# Patient Record
Sex: Female | Born: 1995 | Race: White | Hispanic: No | Marital: Single | Smoking: Former smoker
Health system: Southern US, Community
[De-identification: ages and names within clinical notes are randomized; demographics above are authoritative.]

## PROBLEM LIST (undated history)

## (undated) DIAGNOSIS — E282 Polycystic ovarian syndrome: Secondary | ICD-10-CM

## (undated) DIAGNOSIS — E049 Nontoxic goiter, unspecified: Secondary | ICD-10-CM

## (undated) DIAGNOSIS — N915 Oligomenorrhea, unspecified: Secondary | ICD-10-CM

## (undated) DIAGNOSIS — E063 Autoimmune thyroiditis: Secondary | ICD-10-CM

## (undated) DIAGNOSIS — R109 Unspecified abdominal pain: Secondary | ICD-10-CM

## (undated) HISTORY — DX: Autoimmune thyroiditis: E06.3

## (undated) HISTORY — DX: Oligomenorrhea, unspecified: N91.5

## (undated) HISTORY — DX: Nontoxic goiter, unspecified: E04.9

## (undated) HISTORY — PX: NO PAST SURGERIES: SHX2092

## (undated) HISTORY — DX: Unspecified abdominal pain: R10.9

---
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
## 2015-01-02 ENCOUNTER — Telehealth: Payer: Self-pay | Admitting: Endocrinology

## 2015-01-02 NOTE — Telephone Encounter (Signed)
Please call pt back @ [phone] concerning side effects of medication she is taking

## 2015-01-02 NOTE — Telephone Encounter (Signed)
Requested a call back from the pt to discuss,

## 2015-01-03 NOTE — Telephone Encounter (Signed)
Requested a call back from the pt to discuss.  

## 2015-01-03 NOTE — Telephone Encounter (Signed)
I contacted the pt and advised of MD's note below.

## 2015-01-03 NOTE — Telephone Encounter (Signed)
Pt called stating back in April she missed her thyroid medication for about a month due to her being out of town. Pt stated since April she has been on her period. Pt wanted to know if missing her thyroid medication could have affected this. Pt stated she has checked with her gynecologist and was advised her birth control probably was not the cause of this.  Please advise, Thanks!

## 2015-01-03 NOTE — Telephone Encounter (Signed)
please call patient: You are right: both are possible

## 2015-02-22 ENCOUNTER — Other Ambulatory Visit: Payer: Self-pay | Admitting: Endocrinology

## 2015-02-28 ENCOUNTER — Telehealth: Payer: Self-pay | Admitting: Endocrinology

## 2015-02-28 MED ORDER — LEVOTHYROXINE SODIUM 100 MCG PO TABS: ORAL_TABLET | ORAL | Status: DC

## 2015-02-28 NOTE — Telephone Encounter (Signed)
Rx sent per pt's request.  

## 2015-02-28 NOTE — Telephone Encounter (Signed)
Pt needs refill on levothyroxine, took last pill today

## 2015-08-01 ENCOUNTER — Other Ambulatory Visit: Payer: Self-pay | Admitting: Endocrinology

## 2015-08-05 ENCOUNTER — Other Ambulatory Visit: Payer: Self-pay | Admitting: Endocrinology

## 2015-08-07 ENCOUNTER — Telehealth: Payer: Self-pay | Admitting: Endocrinology

## 2015-08-07 MED ORDER — LEVOTHYROXINE SODIUM 100 MCG PO TABS: ORAL_TABLET | ORAL | Status: DC

## 2015-08-07 NOTE — Telephone Encounter (Signed)
Rx sent, PA is not needed. Rx was refilled for 30 days plus 1 refill. Office visit is due before any more refills can be given.

## 2015-08-07 NOTE — Telephone Encounter (Signed)
Patient need a refill of levothyroxine, with extra refills, and also need a Prior Auth send to   [id number] [place],  - [address] [phone] (Phone) [phone] (Fax)

## 2015-09-03 ENCOUNTER — Other Ambulatory Visit: Payer: Self-pay | Admitting: Endocrinology

## 2015-09-04 ENCOUNTER — Ambulatory Visit: Payer: Medicaid Other | Admitting: Endocrinology

## 2015-09-30 ENCOUNTER — Other Ambulatory Visit: Payer: Self-pay | Admitting: Endocrinology

## 2016-07-16 ENCOUNTER — Encounter: Payer: Self-pay | Admitting: Endocrinology

## 2016-07-16 ENCOUNTER — Ambulatory Visit (INDEPENDENT_AMBULATORY_CARE_PROVIDER_SITE_OTHER): Payer: BLUE CROSS/BLUE SHIELD | Admitting: Endocrinology

## 2016-07-16 VITALS — BP 106/70 | HR 57 | Ht 69.5 in | Wt 183.0 lb

## 2016-07-16 DIAGNOSIS — E039 Hypothyroidism, unspecified: Secondary | ICD-10-CM

## 2016-07-16 MED ORDER — LEVOTHYROXINE SODIUM 100 MCG PO TABS: ORAL_TABLET | ORAL | 1 refills | Status: DC

## 2016-07-16 NOTE — Progress Notes (Signed)
   Subjective:    Patient ID: Vanessa Zavala, female    DOB: 1996-06-21, 21 y.o.   MRN: [id number]  HPI Pt returns for f/u of hypothyroidism (dx'ed in 2012; he has been on synthroid since then; US in 2016 showed heterogeneous tissue, but no nodule).  She has not recently taken synthroid.  She has fatigue and weight gain.   Past Medical History:  Diagnosis Date  . Abdominal pain   . Goiter   . Hypothyroidism, acquired, autoimmune   . Oligomenorrhea   . Thyroiditis, autoimmune     No past surgical history on file.  Social History   Social History  . Marital status: Single    Spouse name: N/A  . Number of children: N/A  . Years of education: N/A   Occupational History  . Not on file.   Social History Main Topics  . Smoking status: Current Every Day Smoker  . Smokeless tobacco: Never Used  . Alcohol use No  . Drug use: No  . Sexual activity: Not on file   Other Topics Concern  . Not on file   Social History Narrative  . No narrative on file    No current outpatient prescriptions on file prior to visit.   No current facility-administered medications on file prior to visit.     No Known Allergies  Family History  Problem Relation Age of Onset  . Thyroid disease Mother   . Diabetes Maternal Grandmother   . Cancer Maternal Grandmother   . Thyroid disease Maternal Grandmother     BP 106/70   Pulse (!) 57   Ht 5' 9.5" (1.765 m)   Wt 183 lb (83 kg)   SpO2 98%   BMI 26.64 kg/m    Review of Systems Denies edema    Objective:   Physical Exam VITAL SIGNS:  See vs page GENERAL: no distress NECK: There is no palpable thyroid enlargement.  No thyroid nodule is palpable.  No palpable lymphadenopathy at the anterior neck.       Assessment & Plan:  Hypothyroidism: therapy limited by medication noncompliance.   Patient is advised the following: Patient Instructions  I have sent a prescription to your pharmacy, to refill the thyroid pill.   In view of your  medical condition, you should avoid pregnancy until we have decided it is safe.   In 1 month, please come to the lab, to recheck the blood test.   Please come back for a follow-up appointment in 6 months.

## 2016-07-16 NOTE — Patient Instructions (Addendum)
I have sent a prescription to your pharmacy, to refill the thyroid pill.   In view of your medical condition, you should avoid pregnancy until we have decided it is safe.   In 1 month, please come to the lab, to recheck the blood test.   Please come back for a follow-up appointment in 6 months.

## 2016-07-30 DIAGNOSIS — E063 Autoimmune thyroiditis: Secondary | ICD-10-CM | POA: Insufficient documentation

## 2016-08-12 ENCOUNTER — Ambulatory Visit: Payer: BLUE CROSS/BLUE SHIELD | Admitting: Physician Assistant

## 2016-08-12 ENCOUNTER — Encounter: Payer: Self-pay | Admitting: Physician Assistant

## 2016-08-16 ENCOUNTER — Encounter: Payer: Self-pay | Admitting: Endocrinology

## 2016-08-16 ENCOUNTER — Other Ambulatory Visit: Payer: BLUE CROSS/BLUE SHIELD

## 2016-11-18 ENCOUNTER — Other Ambulatory Visit: Payer: Self-pay

## 2016-11-18 ENCOUNTER — Telehealth: Payer: Self-pay | Admitting: Endocrinology

## 2016-11-18 MED ORDER — LEVOTHYROXINE SODIUM 100 MCG PO TABS: ORAL_TABLET | ORAL | 1 refills | Status: DC

## 2016-11-18 NOTE — Telephone Encounter (Signed)
Ordered

## 2016-11-18 NOTE — Telephone Encounter (Signed)
Pt needs refill on levothyroxine 100 mg called into [facility] in [place]

## 2016-12-05 ENCOUNTER — Telehealth: Payer: Self-pay | Admitting: Endocrinology

## 2016-12-05 ENCOUNTER — Other Ambulatory Visit: Payer: Self-pay

## 2016-12-05 MED ORDER — LEVOTHYROXINE SODIUM 100 MCG PO TABS: ORAL_TABLET | ORAL | 1 refills | Status: DC

## 2016-12-05 NOTE — Telephone Encounter (Signed)
[place], asking for another prescription of: levothyroxine (SYNTHROID, LEVOTHROID) 100 MCG tablet [[id number]][[id number]]

## 2016-12-05 NOTE — Telephone Encounter (Signed)
Ordered

## 2016-12-05 NOTE — Telephone Encounter (Signed)
Ok to refill 

## 2017-01-13 ENCOUNTER — Ambulatory Visit: Payer: BLUE CROSS/BLUE SHIELD | Admitting: Endocrinology

## 2017-01-13 ENCOUNTER — Telehealth: Payer: Self-pay | Admitting: Endocrinology

## 2017-01-13 DIAGNOSIS — Z0289 Encounter for other administrative examinations: Secondary | ICD-10-CM

## 2017-01-13 NOTE — Telephone Encounter (Signed)
Please come back for a follow-up appointment in 3 months 

## 2017-01-13 NOTE — Telephone Encounter (Signed)
Patient no showed today's appt. Please advise on how to follow up. °A. No follow up necessary. °B. Follow up urgent. Contact patient immediately. °C. Follow up necessary. Contact patient and schedule visit in ___ days. °D. Follow up advised. Contact patient and schedule visit in ____weeks. ° °

## 2017-01-28 ENCOUNTER — Encounter: Payer: Self-pay | Admitting: Endocrinology

## 2017-01-28 NOTE — Telephone Encounter (Signed)
Attempted to contact the pt, no answer and the vm is not yet set up. Mailed letter

## 2018-02-18 ENCOUNTER — Other Ambulatory Visit: Payer: Self-pay

## 2018-02-18 ENCOUNTER — Ambulatory Visit (HOSPITAL_COMMUNITY)
Admission: EM | Admit: 2018-02-18 | Discharge: 2018-02-18 | Disposition: A | Discharge status: Home / Self Care | Payer: 59 | Attending: Family Medicine | Admitting: Family Medicine

## 2018-02-18 ENCOUNTER — Encounter (HOSPITAL_COMMUNITY): Payer: Self-pay | Admitting: Emergency Medicine

## 2018-02-18 DIAGNOSIS — N760 Acute vaginitis: Secondary | ICD-10-CM

## 2018-02-18 DIAGNOSIS — Z79890 Hormone replacement therapy (postmenopausal): Secondary | ICD-10-CM | POA: Insufficient documentation

## 2018-02-18 DIAGNOSIS — Z3202 Encounter for pregnancy test, result negative: Secondary | ICD-10-CM

## 2018-02-18 DIAGNOSIS — B9689 Other specified bacterial agents as the cause of diseases classified elsewhere: Secondary | ICD-10-CM | POA: Diagnosis not present

## 2018-02-18 DIAGNOSIS — E039 Hypothyroidism, unspecified: Secondary | ICD-10-CM | POA: Diagnosis not present

## 2018-02-18 DIAGNOSIS — F17200 Nicotine dependence, unspecified, uncomplicated: Secondary | ICD-10-CM | POA: Diagnosis not present

## 2018-02-18 DIAGNOSIS — N898 Other specified noninflammatory disorders of vagina: Secondary | ICD-10-CM | POA: Diagnosis present

## 2018-02-18 LAB — POCT URINALYSIS DIP (DEVICE)
Bilirubin Urine: NEGATIVE
Glucose, UA: NEGATIVE mg/dL
KETONES UR: NEGATIVE mg/dL
Leukocytes, UA: NEGATIVE
Nitrite: NEGATIVE
PH: 7 (ref 5.0–8.0)
PROTEIN: NEGATIVE mg/dL
SPECIFIC GRAVITY, URINE: 1.015 (ref 1.005–1.030)
Urobilinogen, UA: 0.2 mg/dL (ref 0.0–1.0)

## 2018-02-18 LAB — POCT PREGNANCY, URINE: Preg Test, Ur: NEGATIVE

## 2018-02-18 MED ORDER — METRONIDAZOLE 500 MG PO TABS: 500.0000 mg | ORAL_TABLET | Freq: Two times a day (BID) | ORAL | 0 refills | Status: DC

## 2018-02-18 NOTE — ED Provider Notes (Signed)
[facility]    CSN: [id number] Arrival date & time: 02/18/18  1136     History   Chief Complaint Chief Complaint  Patient presents with  . Vaginal Discharge    HPI Vanessa Zavala is a 22 y.o. female.   Patient has vaginal discharge associated with odor.  She denies itching.  Been with same sexual partner for over a year.  Denies pain.  HPI  Past Medical History:  Diagnosis Date  . Abdominal pain   . Goiter   . Hypothyroidism, acquired, autoimmune   . Oligomenorrhea   . Thyroiditis, autoimmune     Patient Active Problem List   Diagnosis Date Noted  . Abdominal pain   . Hypothyroidism, acquired, autoimmune   . Thyroiditis, autoimmune   . Goiter   . Oligomenorrhea   . Hypothyroid 11/29/2010    History reviewed. No pertinent surgical history.  OB History   None      Home Medications    Prior to Admission medications   Medication Sig Start Date End Date Taking? Authorizing Provider  levothyroxine (SYNTHROID, LEVOTHROID) 100 MCG tablet TAKE 1 TABLET (100 MCG TOTAL) BY MOUTH DAILY BEFORE BREAKFAST. 12/05/16   Romero BellingEllison, Sean, MD    Family History Family History  Problem Relation Age of Onset  . Thyroid disease Mother   . Diabetes Maternal Grandmother   . Cancer Maternal Grandmother   . Thyroid disease Maternal Grandmother     Social History Social History   Tobacco Use  . Smoking status: Current Every Day Smoker  . Smokeless tobacco: Never Used  Substance Use Topics  . Alcohol use: No  . Drug use: No     Allergies   Patient has no known allergies.   Review of Systems Review of Systems  Constitutional: Negative for chills and fever.  HENT: Negative for ear pain and sore throat.   Eyes: Negative for pain and visual disturbance.  Respiratory: Negative for cough and shortness of breath.   Cardiovascular: Negative for chest pain and palpitations.  Gastrointestinal: Negative for abdominal pain and vomiting.  Genitourinary: Positive  for vaginal discharge. Negative for dysuria and hematuria.  Musculoskeletal: Negative for arthralgias and back pain.  Skin: Negative for color change and rash.  Neurological: Negative for seizures and syncope.  All other systems reviewed and are negative.    Physical Exam Triage Vital Signs ED Triage Vitals  Enc Vitals Group     BP 02/18/18 1151 120/75     Pulse Rate 02/18/18 1151 85     Resp --      Temp 02/18/18 1151 98.4 F (36.9 C)     Temp Source 02/18/18 1151 Oral     SpO2 02/18/18 1151 100 %     Weight --      Height --      Head Circumference --      Peak Flow --      Pain Score 02/18/18 1149 0     Pain Loc --      Pain Edu? --      Excl. in GC? --    No data found.  Updated Vital Signs BP 120/75 (BP Location: Left Arm)   Pulse 85   Temp 98.4 F (36.9 C) (Oral)   LMP 02/16/2018 (Exact Date)   SpO2 100%   Visual Acuity Right Eye Distance:   Left Eye Distance:   Bilateral Distance:    Right Eye Near:   Left Eye Near:    Bilateral  Near:     Physical Exam  Constitutional: She appears well-developed and well-nourished.  Genitourinary: Vaginal discharge found.     UC Treatments / Results  Labs (all labs ordered are listed, but only abnormal results are displayed) Labs Reviewed - No data to display  EKG None  Radiology No results found.  Procedures Procedures (including critical care time)  Medications Ordered in UC Medications - No data to display  Initial Impression / Assessment and Plan / UC Course  I have reviewed the triage vital signs and the nursing notes.  Pertinent labs & imaging results that were available during my care of the patient were reviewed by me and considered in my medical decision making (see chart for details).     Vaginitis, probable bacterial vaginosis.  Wet prep taken. Final Clinical Impressions(s) / UC Diagnoses   Final diagnoses:  BV (bacterial vaginosis)   Discharge Instructions   None    ED  Prescriptions    None     Controlled Substance Prescriptions [place] Controlled Substance Registry consulted? No   Frederica KusterMiller, Stephen M, MD 02/18/18 [phone]

## 2018-02-18 NOTE — ED Triage Notes (Signed)
Pt reports vaginal discharge with a foul odor and some vaginal burning for the last month.

## 2018-02-19 LAB — CERVICOVAGINAL ANCILLARY ONLY
Chlamydia: NEGATIVE
Neisseria Gonorrhea: NEGATIVE
Trichomonas: NEGATIVE

## 2018-05-31 ENCOUNTER — Other Ambulatory Visit: Payer: Self-pay

## 2018-05-31 ENCOUNTER — Encounter (HOSPITAL_COMMUNITY): Payer: Self-pay

## 2018-05-31 ENCOUNTER — Ambulatory Visit (HOSPITAL_COMMUNITY)
Admission: EM | Admit: 2018-05-31 | Discharge: 2018-05-31 | Disposition: A | Discharge status: Home / Self Care | Payer: 59 | Attending: Family Medicine | Admitting: Family Medicine

## 2018-05-31 DIAGNOSIS — R300 Dysuria: Secondary | ICD-10-CM | POA: Diagnosis present

## 2018-05-31 DIAGNOSIS — N3001 Acute cystitis with hematuria: Secondary | ICD-10-CM | POA: Insufficient documentation

## 2018-05-31 DIAGNOSIS — F17200 Nicotine dependence, unspecified, uncomplicated: Secondary | ICD-10-CM | POA: Insufficient documentation

## 2018-05-31 DIAGNOSIS — R350 Frequency of micturition: Secondary | ICD-10-CM | POA: Diagnosis present

## 2018-05-31 LAB — POCT URINALYSIS DIP (DEVICE)
Glucose, UA: 250 mg/dL — AB
Hgb urine dipstick: NEGATIVE
Ketones, ur: 15 mg/dL — AB
Nitrite: POSITIVE — AB
Protein, ur: >=300 mg/dL — AB
Specific Gravity, Urine: 1.01 (ref 1.005–1.030)
pH: 5 (ref 5.0–8.0)

## 2018-05-31 LAB — POCT PREGNANCY, URINE: Preg Test, Ur: NEGATIVE

## 2018-05-31 MED ORDER — PHENAZOPYRIDINE HCL 200 MG PO TABS: 200.0000 mg | ORAL_TABLET | Freq: Three times a day (TID) | ORAL | 0 refills | Status: DC

## 2018-05-31 MED ORDER — CEPHALEXIN 500 MG PO CAPS: 500.0000 mg | ORAL_CAPSULE | Freq: Two times a day (BID) | ORAL | 0 refills | Status: AC

## 2018-05-31 NOTE — ED Provider Notes (Signed)
[facility]   CC: UTI symptoms  SUBJECTIVE:  Vanessa Zavala is a 22 y.o. female who complains of urinary frequency, urgency and dysuria for the past 2 days with worsening symptoms this morning.  Admits to excessive caffeine intake 2 days ago, and sexual intercourse yesterday.  Admits to constant pressure localized to lower abdominal.  Has tried OTC AZO without relief.  Symptoms are made worse with urination.  Admits to similar symptoms in the past and diagnosed with UTI.  Admits to 2 episodes of vomiting yesterday, but now resovled.  Denies fever, chills, nausea, flank pain, abnormal vaginal discharge or bleeding, hematuria.    LMP: No LMP recorded. (Menstrual status: Irregular Periods).  ROS: As in HPI.  Past Medical History:  Diagnosis Date  . Abdominal pain   . Goiter   . Hypothyroidism, acquired, autoimmune   . Oligomenorrhea   . Thyroiditis, autoimmune    History reviewed. No pertinent surgical history. No Known Allergies No current facility-administered medications on file prior to encounter.    Current Outpatient Medications on File Prior to Encounter  Medication Sig Dispense Refill  . levothyroxine (SYNTHROID, LEVOTHROID) 100 MCG tablet TAKE 1 TABLET (100 MCG TOTAL) BY MOUTH DAILY BEFORE BREAKFAST. 30 tablet 1  . metroNIDAZOLE (FLAGYL) 500 MG tablet Take 1 tablet (500 mg total) by mouth 2 (two) times daily. 14 tablet 0   Social History   Socioeconomic History  . Marital status: Single    Spouse name: Not on file  . Number of children: Not on file  . Years of education: Not on file  . Highest education level: Not on file  Occupational History  . Not on file  Social Needs  . Financial resource strain: Not on file  . Food insecurity:    Worry: Not on file    Inability: Not on file  . Transportation needs:    Medical: Not on file    Non-medical: Not on file  Tobacco Use  . Smoking status: Current Every Day Smoker  . Smokeless tobacco: Never Used    Substance and Sexual Activity  . Alcohol use: No  . Drug use: No  . Sexual activity: Not on file  Lifestyle  . Physical activity:    Days per week: Not on file    Minutes per session: Not on file  . Stress: Not on file  Relationships  . Social connections:    Talks on phone: Not on file    Gets together: Not on file    Attends religious service: Not on file    Active member of club or organization: Not on file    Attends meetings of clubs or organizations: Not on file    Relationship status: Not on file  . Intimate partner violence:    Fear of current or ex partner: Not on file    Emotionally abused: Not on file    Physically abused: Not on file    Forced sexual activity: Not on file  Other Topics Concern  . Not on file  Social History Narrative  . Not on file   Family History  Problem Relation Age of Onset  . Thyroid disease Mother   . Diabetes Maternal Grandmother   . Cancer Maternal Grandmother   . Thyroid disease Maternal Grandmother     OBJECTIVE:  Vitals:   05/31/18 1524  BP: 118/62  Pulse: 71  Resp: 18  Temp: 98.1 F (36.7 C)  TempSrc: Oral  SpO2: 100%   General  appearance: AOx3 in no acute distress HEENT: NCAT.  Oropharynx clear.  Lungs: clear to auscultation bilaterally without adventitious breath sounds Heart: regular rate and rhythm.  Radial pulses 2+ symmetrical bilaterally Abdomen: soft; non-distended; mild tenderness over suprapubic region; bowel sounds present; no guarding Back: no CVA tenderness Extremities: no edema; symmetrical with no gross deformities Skin: warm and dry Neurologic: Ambulates from chair to exam table without difficulty Psychological: alert and cooperative; normal mood and affect  Labs Reviewed  POCT URINALYSIS DIP (DEVICE) - Abnormal; Notable for the following components:      Result Value   Glucose, UA 250 (*)    Bilirubin Urine LARGE (*)    Ketones, ur 15 (*)    Protein, ur >=300 (*)    Nitrite POSITIVE (*)     Leukocytes, UA LARGE (*)    All other components within normal limits  URINE CULTURE  POCT PREGNANCY, URINE    ASSESSMENT & PLAN:  1. Acute cystitis with hematuria     Meds ordered this encounter  Medications  . cephALEXin (KEFLEX) 500 MG capsule    Sig: Take 1 capsule (500 mg total) by mouth 2 (two) times daily for 7 days.    Dispense:  14 capsule    Refill:  0    Order Specific Question:   Supervising Provider    Answer:   Isa RankinMURRAY, LAURA WILSON [phone][[id number]]  . phenazopyridine (PYRIDIUM) 200 MG tablet    Sig: Take 1 tablet (200 mg total) by mouth 3 (three) times daily.    Dispense:  6 tablet    Refill:  0    Order Specific Question:   Supervising Provider    Answer:   Isa RankinMURRAY, LAURA WILSON [[id number]]   Negative pregnancy Urine showed possible UTI Urine culture sent.  Push fluids and get plenty of rest.   Take antibiotic as directed and to completion Take pyridium as prescribed and as needed for symptomatic relief Follow up with PCP or [facility] if symptoms persists Return here or go to ER if you have any new or worsening symptoms such as fever, worsening abdominal pain, nausea/vomiting, flank pain, etc...  Outlined signs and symptoms indicating need for more acute intervention. Patient verbalized understanding. After Visit Summary given.     Rennis HardingWurst, Briana Farner, PA-C 05/31/18 [phone]

## 2018-05-31 NOTE — ED Triage Notes (Signed)
Pt cc UTI 2 days..the patient states she has a lot of pressure and burning when she voids.

## 2018-05-31 NOTE — Discharge Instructions (Signed)
Negative pregnancy Urine showed possible UTI Urine culture sent.  Push fluids and get plenty of rest.   Take antibiotic as directed and to completion Take pyridium as prescribed and as needed for symptomatic relief Follow up with PCP or [facility] if symptoms persists Return here or go to ER if you have any new or worsening symptoms such as fever, worsening abdominal pain, nausea/vomiting, flank pain, etc...Marland Kitchen

## 2018-06-03 ENCOUNTER — Telehealth (HOSPITAL_COMMUNITY): Payer: Self-pay | Admitting: Emergency Medicine

## 2018-06-03 LAB — URINE CULTURE: Culture: 40000 — AB

## 2018-06-03 NOTE — Telephone Encounter (Signed)
Urine culture was positive for e coli and was given keflex  at urgent care visit. Attempted to reach patient. No answer at this time.   

## 2018-08-13 ENCOUNTER — Other Ambulatory Visit: Payer: Self-pay

## 2018-08-13 ENCOUNTER — Emergency Department
Admission: EM | Admit: 2018-08-13 | Discharge: 2018-08-13 | Disposition: A | Discharge status: Home / Self Care | Payer: Self-pay | Attending: Emergency Medicine | Admitting: Emergency Medicine

## 2018-08-13 ENCOUNTER — Emergency Department: Payer: Self-pay

## 2018-08-13 ENCOUNTER — Encounter: Payer: Self-pay | Admitting: Emergency Medicine

## 2018-08-13 DIAGNOSIS — Y9389 Activity, other specified: Secondary | ICD-10-CM | POA: Insufficient documentation

## 2018-08-13 DIAGNOSIS — S161XXA Strain of muscle, fascia and tendon at neck level, initial encounter: Secondary | ICD-10-CM | POA: Insufficient documentation

## 2018-08-13 DIAGNOSIS — S39012A Strain of muscle, fascia and tendon of lower back, initial encounter: Secondary | ICD-10-CM | POA: Insufficient documentation

## 2018-08-13 DIAGNOSIS — F17210 Nicotine dependence, cigarettes, uncomplicated: Secondary | ICD-10-CM | POA: Insufficient documentation

## 2018-08-13 DIAGNOSIS — G44319 Acute post-traumatic headache, not intractable: Secondary | ICD-10-CM | POA: Insufficient documentation

## 2018-08-13 DIAGNOSIS — Y999 Unspecified external cause status: Secondary | ICD-10-CM | POA: Insufficient documentation

## 2018-08-13 DIAGNOSIS — Z79899 Other long term (current) drug therapy: Secondary | ICD-10-CM | POA: Insufficient documentation

## 2018-08-13 DIAGNOSIS — Y92410 Unspecified street and highway as the place of occurrence of the external cause: Secondary | ICD-10-CM | POA: Insufficient documentation

## 2018-08-13 DIAGNOSIS — E038 Other specified hypothyroidism: Secondary | ICD-10-CM | POA: Insufficient documentation

## 2018-08-13 LAB — POCT PREGNANCY, URINE: Preg Test, Ur: NEGATIVE

## 2018-08-13 MED ORDER — METHOCARBAMOL 500 MG PO TABS: 500.0000 mg | ORAL_TABLET | Freq: Four times a day (QID) | ORAL | 0 refills | Status: DC | PRN

## 2018-08-13 MED ORDER — HYDROCODONE-ACETAMINOPHEN 5-325 MG PO TABS: 1.0000 | ORAL_TABLET | Freq: Four times a day (QID) | ORAL | 0 refills | Status: DC | PRN
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
## 2018-08-13 NOTE — Discharge Instructions (Addendum)
Follow-up with your primary care provider or can no clinic acute care if any continued problems.  Take medication only as directed.  Both medications at same time could cause drowsiness. You may use ice or heat to your muscles as needed for discomfort.  Even with medication you will be sore for approximately 4 to 5 days.  This is not unusual.  CT scan of your head and neck were negative.  Lumbar spine and pelvis x-rays were negative for acute fracture.
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
## 2018-08-25 ENCOUNTER — Other Ambulatory Visit: Payer: Self-pay

## 2018-08-25 ENCOUNTER — Emergency Department
Admission: EM | Admit: 2018-08-25 | Discharge: 2018-08-25 | Disposition: A | Discharge status: Home / Self Care | Payer: Self-pay | Attending: Emergency Medicine | Admitting: Emergency Medicine

## 2018-08-25 DIAGNOSIS — X58XXXA Exposure to other specified factors, initial encounter: Secondary | ICD-10-CM | POA: Insufficient documentation

## 2018-08-25 DIAGNOSIS — S01311A Laceration without foreign body of right ear, initial encounter: Secondary | ICD-10-CM | POA: Insufficient documentation

## 2018-08-25 DIAGNOSIS — Z79899 Other long term (current) drug therapy: Secondary | ICD-10-CM | POA: Insufficient documentation

## 2018-08-25 DIAGNOSIS — F17200 Nicotine dependence, unspecified, uncomplicated: Secondary | ICD-10-CM | POA: Insufficient documentation

## 2018-08-25 DIAGNOSIS — Y998 Other external cause status: Secondary | ICD-10-CM | POA: Insufficient documentation

## 2018-08-25 DIAGNOSIS — Y9389 Activity, other specified: Secondary | ICD-10-CM | POA: Insufficient documentation

## 2018-08-25 DIAGNOSIS — Y929 Unspecified place or not applicable: Secondary | ICD-10-CM | POA: Insufficient documentation

## 2018-08-25 DIAGNOSIS — E039 Hypothyroidism, unspecified: Secondary | ICD-10-CM | POA: Insufficient documentation
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
## 2018-08-25 NOTE — ED Provider Notes (Signed)
[facility] Provider Note  ____________________________________________  Time seen: Approximately 6:10 PM  I have reviewed the triage vital signs and the nursing notes.   HISTORY  Chief Complaint Ear Injury    HPI Vanessa Zavala is a 23 y.o. female who presents the emergency department complaining of trauma to the right earlobe.  Patient reports that she previously had gauges, the left had almost closed however there was a large area that had not healed on the right ear.  Patient was wearing ear buds and had an earring in.  Patient reports when the ear became loose to call on the ear and causing trauma to the previous ear gauge portion of her ear.  Patient reports that the edges have separated.  There was no bleeding.  She reports that there was minimal pain as well.  Patient is requesting closure at this time.    Past Medical History:  Diagnosis Date  . Abdominal pain   . Goiter   . Hypothyroidism, acquired, autoimmune   . Oligomenorrhea   . Thyroiditis, autoimmune     Patient Active Problem List   Diagnosis Date Noted  . Abdominal pain   . Hypothyroidism, acquired, autoimmune   . Thyroiditis, autoimmune   . Goiter   . Oligomenorrhea   . Hypothyroid 11/29/2010    History reviewed. No pertinent surgical history.  Prior to Admission medications   Medication Sig Start Date End Date Taking? Authorizing Provider  HYDROcodone-acetaminophen (NORCO/VICODIN) 5-325 MG tablet Take 1 tablet by mouth every 6 (six) hours as needed for moderate pain. 08/13/18   Tommi Rumps, PA-C  levothyroxine (SYNTHROID, LEVOTHROID) 100 MCG tablet TAKE 1 TABLET (100 MCG TOTAL) BY MOUTH DAILY BEFORE BREAKFAST. 12/05/16   Romero Belling, MD  methocarbamol (ROBAXIN) 500 MG tablet Take 1 tablet (500 mg total) by mouth every 6 (six) hours as needed. 08/13/18   Tommi Rumps, PA-C    Allergies Patient has no known allergies.  Family History  Problem  Relation Age of Onset  . Thyroid disease Mother   . Diabetes Maternal Grandmother   . Cancer Maternal Grandmother   . Thyroid disease Maternal Grandmother     Social History Social History   Tobacco Use  . Smoking status: Current Every Day Smoker  . Smokeless tobacco: Never Used  Substance Use Topics  . Alcohol use: Yes  . Drug use: No     Review of Systems  Constitutional: No fever/chills Eyes: No visual changes. No discharge ENT: Trauma to the right earlobe Cardiovascular: no chest pain. Respiratory: no cough. No SOB. Gastrointestinal: No abdominal pain.  No nausea, no vomiting.  Musculoskeletal: Negative for musculoskeletal pain. Skin: Negative for rash, abrasions, lacerations, ecchymosis. Neurological: Negative for headaches, focal weakness or numbness. 10-point ROS otherwise negative.  ____________________________________________   PHYSICAL EXAM:  VITAL SIGNS: ED Triage Vitals [08/25/18 1736]  Enc Vitals Group     BP 137/83     Pulse Rate 64     Resp 16     Temp 98.6 F (37 C)     Temp Source Oral     SpO2 100 %     Weight 160 lb (72.6 kg)     Height 5\' 11"  (1.803 m)     Head Circumference      Peak Flow      Pain Score 0     Pain Loc      Pain Edu?      Excl. in GC?  Constitutional: Alert and oriented. Well appearing and in no acute distress. Eyes: Conjunctivae are normal. PERRL. EOMI. Head: Atraumatic. ENT:      Ears: Visualization of the outer right ear reveals signs of previous ear gauge.  The inferior aspect of the earlobe has sustained trauma.  However there is no fresh tissue edges.  It appears that separation of the skin occurred through a very fine portion of epithelial tissue.  No bleeding or dried blood.  No open wound.  No other trauma to the right ear visualized.      Nose: No congestion/rhinnorhea.      Mouth/Throat: Mucous membranes are moist.  Neck: No stridor.    Cardiovascular: Normal rate, regular rhythm. Normal S1 and S2.   Good peripheral circulation. Respiratory: Normal respiratory effort without tachypnea or retractions. Lungs CTAB. Good air entry to the bases with no decreased or absent breath sounds. Musculoskeletal: Full range of motion to all extremities. No gross deformities appreciated. Neurologic:  Normal speech and language. No gross focal neurologic deficits are appreciated.  Skin:  Skin is warm, dry and intact. No rash noted. Psychiatric: Mood and affect are normal. Speech and behavior are normal. Patient exhibits appropriate insight and judgement.   ____________________________________________   LABS (all labs ordered are listed, but only abnormal results are displayed)  Labs Reviewed - No data to display ____________________________________________  EKG   ____________________________________________  RADIOLOGY   No results found.  ____________________________________________    PROCEDURES  Procedure(s) performed:    Procedures    Medications - No data to display   ____________________________________________   INITIAL IMPRESSION / ASSESSMENT AND PLAN / ED COURSE  Pertinent labs & imaging results that were available during my care of the patient were reviewed by me and considered in my medical decision making (see chart for details).  Review of the [place] CSRS was performed in accordance of the [facility] prior to dispensing any controlled drugs.      Patient's diagnosis is consistent with earlobe laceration.  Patient presents emergency department with a laceration through a previously gauged ear lobe.  Separation occurs through what appears to be epithelial tissue.  There is no signs of bleeding or dried blood.  On exam, it does not appear that area would be amenable to suture as there is no freshly injured tissue to reattach.  As such, I have advised that I am unable to perform closure at this time.  Patient will be referred to ENT for evaluation to see if they are able to  perform any type of closure to this area.  No prescriptions at this time.  Follow-up with ENT..  Patient is given ED precautions to return to the ED for any worsening or new symptoms.     ____________________________________________  FINAL CLINICAL IMPRESSION(S) / ED DIAGNOSES  Final diagnoses:  Laceration of right ear lobe, initial encounter      NEW MEDICATIONS STARTED DURING THIS VISIT:  ED Discharge Orders    None          This chart was dictated using voice recognition software/Dragon. Despite best efforts to proofread, errors can occur which can change the meaning. Any change was purely unintentional.    Racheal Patches, PA-C 08/25/18 1829    Jeanmarie Plant, MD 08/25/18 2132

## 2018-08-25 NOTE — ED Notes (Signed)
See triage note  States she ripped her right ear lobe  States she had her ears gauged and had er bud in

## 2018-09-04 ENCOUNTER — Other Ambulatory Visit: Payer: Self-pay

## 2018-09-04 ENCOUNTER — Ambulatory Visit
Admission: EM | Admit: 2018-09-04 | Discharge: 2018-09-04 | Disposition: A | Discharge status: Home / Self Care | Payer: Self-pay | Attending: Family Medicine | Admitting: Family Medicine

## 2018-09-04 DIAGNOSIS — Z23 Encounter for immunization: Secondary | ICD-10-CM

## 2018-09-04 DIAGNOSIS — W272XXA Contact with scissors, initial encounter: Secondary | ICD-10-CM

## 2018-09-04 DIAGNOSIS — S61213A Laceration without foreign body of left middle finger without damage to nail, initial encounter: Secondary | ICD-10-CM

## 2018-09-04 MED ORDER — TETANUS-DIPHTH-ACELL PERTUSSIS 5-2.5-18.5 LF-MCG/0.5 IM SUSP
0.5000 mL | Freq: Once | INTRAMUSCULAR | Status: AC
  Administered 2018-09-04: 0.5 mL via INTRAMUSCULAR

## 2018-09-04 NOTE — Discharge Instructions (Signed)
Follow up in 7 or 8 days for suture removal or sooner if any problems

## 2018-09-04 NOTE — ED Provider Notes (Addendum)
[facility]    CSN: [id number] Arrival date & time: 09/04/18  1524     History   Chief Complaint Chief Complaint  Patient presents with  . Laceration    HPI Vanessa Zavala is a 23 y.o. female.   The history is provided by the patient.  Laceration  Location:  Finger Finger laceration location:  L middle finger Length:  1.5 cm Depth:  Cutaneous Quality comment:  V-shaped Bleeding: venous   Time since incident:  3 hours Injury mechanism: with scissors while cutting hair at work. Foreign body present:  No foreign bodies Tetanus status:  Unknown   Past Medical History:  Diagnosis Date  . Abdominal pain   . Goiter   . Hypothyroidism, acquired, autoimmune   . Oligomenorrhea   . Thyroiditis, autoimmune     Patient Active Problem List   Diagnosis Date Noted  . Abdominal pain   . Hypothyroidism, acquired, autoimmune   . Thyroiditis, autoimmune   . Goiter   . Oligomenorrhea   . Hypothyroid 11/29/2010    Past Surgical History:  Procedure Laterality Date  . NO PAST SURGERIES      OB History   No obstetric history on file.      Home Medications    Prior to Admission medications   Medication Sig Start Date End Date Taking? Authorizing Provider  HYDROcodone-acetaminophen (NORCO/VICODIN) 5-325 MG tablet Take 1 tablet by mouth every 6 (six) hours as needed for moderate pain. 08/13/18   Tommi Rumps, PA-C  levothyroxine (SYNTHROID, LEVOTHROID) 100 MCG tablet TAKE 1 TABLET (100 MCG TOTAL) BY MOUTH DAILY BEFORE BREAKFAST. 12/05/16   Romero Belling, MD  methocarbamol (ROBAXIN) 500 MG tablet Take 1 tablet (500 mg total) by mouth every 6 (six) hours as needed. 08/13/18   Tommi Rumps, PA-C    Family History Family History  Problem Relation Age of Onset  . Thyroid disease Mother   . Diabetes Maternal Grandmother   . Cancer Maternal Grandmother   . Thyroid disease Maternal Grandmother     Social History Social History   Tobacco Use  .  Smoking status: Former Games developer  . Smokeless tobacco: Never Used  Substance Use Topics  . Alcohol use: Yes    Comment: occasionally  . Drug use: No     Allergies   Patient has no known allergies.   Review of Systems Review of Systems   Physical Exam Triage Vital Signs ED Triage Vitals  Enc Vitals Group     BP 09/04/18 1559 134/85     Pulse Rate 09/04/18 1559 66     Resp 09/04/18 1559 18     Temp 09/04/18 1559 98 F (36.7 C)     Temp Source 09/04/18 1559 Oral     SpO2 09/04/18 1559 100 %     Weight 09/04/18 1556 160 lb (72.6 kg)     Height 09/04/18 1556 5\' 11"  (1.803 m)     Head Circumference --      Peak Flow --      Pain Score 09/04/18 1556 0     Pain Loc --      Pain Edu? --      Excl. in GC? --    No data found.  Updated Vital Signs BP 134/85 (BP Location: Left Arm)   Pulse 66   Temp 98 F (36.7 C) (Oral)   Resp 18   Ht 5\' 11"  (1.803 m)   Wt 72.6 kg   LMP 08/18/2018  SpO2 100%   BMI 22.32 kg/m   Visual Acuity Right Eye Distance:   Left Eye Distance:   Bilateral Distance:    Right Eye Near:   Left Eye Near:    Bilateral Near:     Physical Exam Vitals signs and nursing note reviewed.  Constitutional:      General: She is not in acute distress.    Appearance: She is not toxic-appearing or diaphoretic.  Musculoskeletal:     Left hand: She exhibits normal range of motion. Lacerations: v-shaped laceration over the dosal aspect of left middle finger over the PIP joint  Normal sensation noted. Normal strength noted.     Comments: Normal range of motion; neurovascularly intact  Neurological:     Mental Status: She is alert.      UC Treatments / Results  Labs (all labs ordered are listed, but only abnormal results are displayed) Labs Reviewed - No data to display  EKG None  Radiology No results found.  Procedures Laceration Repair Date/Time: 09/04/2018 5:51 PM Performed by: Payton Mccallum, MD Authorized by: Payton Mccallum, MD    Consent:    Consent obtained:  Verbal   Consent given by:  Patient   Risks discussed:  Infection, need for additional repair, nerve damage, poor wound healing, pain, poor cosmetic result, retained foreign body, tendon damage and vascular damage   Alternatives discussed:  No treatment Anesthesia (see MAR for exact dosages):    Anesthesia method:  Local infiltration   Local anesthetic:  Lidocaine 1% w/o epi Laceration details:    Location:  Finger   Finger location:  L long finger   Length (cm):  1.5 Repair type:    Repair type:  Simple Pre-procedure details:    Preparation:  Patient was prepped and draped in usual sterile fashion Exploration:    Hemostasis achieved with:  Direct pressure   Wound exploration: wound explored through full range of motion and entire depth of wound probed and visualized     Wound extent: areolar tissue violated     Contaminated: no   Treatment:    Area cleansed with:  Hibiclens and Betadine   Amount of cleaning:  Standard   Irrigation solution:  Sterile saline and sterile water   Irrigation method:  Syringe Skin repair:    Repair method:  Sutures   Suture size:  5-0   Suture material:  Nylon   Suture technique:  Simple interrupted   Number of sutures:  5 Approximation:    Approximation:  Close Post-procedure details:    Dressing:  Antibiotic ointment and non-adherent dressing   Patient tolerance of procedure:  Tolerated well, no immediate complications   (including critical care time)  Medications Ordered in UC Medications  Tdap (BOOSTRIX) injection 0.5 mL (0.5 mLs Intramuscular Given 09/04/18 1605)    Initial Impression / Assessment and Plan / UC Course  I have reviewed the triage vital signs and the nursing notes.  Pertinent labs & imaging results that were available during my care of the patient were reviewed by me and considered in my medical decision making (see chart for details).      Final Clinical Impressions(s) / UC  Diagnoses   Final diagnoses:  Laceration of left middle finger without foreign body without damage to nail, initial encounter    ED Prescriptions    None      1. diagnosis reviewed with patient; tetanus vaccine given 2. Procedure as per note above  3. Recommend supportive treatment with routine  wound care 4. Follow-up in 7- 8 days for suture removal or sooner prn prn if any problems  Controlled Substance Prescriptions [place] Controlled Substance Registry consulted? Not Applicable   Payton Mccallum, MD 09/04/18 Derrill Memo    Payton Mccallum, MD 09/04/18 [phone]

## 2018-09-04 NOTE — ED Triage Notes (Signed)
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 Laceration is to her left middle finger, middle knuckle.

## 2019-01-21 ENCOUNTER — Other Ambulatory Visit: Payer: Self-pay | Admitting: *Deleted

## 2019-01-21 DIAGNOSIS — Z20822 Contact with and (suspected) exposure to covid-19: Secondary | ICD-10-CM

## 2019-01-27 LAB — NOVEL CORONAVIRUS, NAA: SARS-CoV-2, NAA: NOT DETECTED

## 2019-01-29 ENCOUNTER — Telehealth: Payer: Self-pay | Admitting: General Practice

## 2019-01-29 NOTE — Telephone Encounter (Signed)
Pt called in and gave NEG covid results, pt expressed understanding

## 2019-03-17 ENCOUNTER — Encounter: Payer: Self-pay | Admitting: Emergency Medicine

## 2019-03-17 ENCOUNTER — Other Ambulatory Visit: Payer: Self-pay

## 2019-03-17 ENCOUNTER — Ambulatory Visit
Admission: EM | Admit: 2019-03-17 | Discharge: 2019-03-17 | Disposition: A | Discharge status: Home / Self Care | Payer: Self-pay | Attending: Family Medicine | Admitting: Family Medicine

## 2019-03-17 DIAGNOSIS — R300 Dysuria: Secondary | ICD-10-CM

## 2019-03-17 DIAGNOSIS — F419 Anxiety disorder, unspecified: Secondary | ICD-10-CM

## 2019-03-17 DIAGNOSIS — F329 Major depressive disorder, single episode, unspecified: Secondary | ICD-10-CM

## 2019-03-17 DIAGNOSIS — F32A Depression, unspecified: Secondary | ICD-10-CM

## 2019-03-17 DIAGNOSIS — K5900 Constipation, unspecified: Secondary | ICD-10-CM

## 2019-03-17 LAB — URINALYSIS, COMPLETE (UACMP) WITH MICROSCOPIC
Glucose, UA: 100 mg/dL — AB
Hgb urine dipstick: NEGATIVE
Nitrite: POSITIVE — AB
Protein, ur: 30 mg/dL — AB
Specific Gravity, Urine: 1.025 (ref 1.005–1.030)
pH: 7 (ref 5.0–8.0)

## 2019-03-17 MED ORDER — POLYETHYLENE GLYCOL 3350 17 GM/SCOOP PO POWD: ORAL | 0 refills | Status: DC

## 2019-03-17 MED ORDER — ONDANSETRON HCL 4 MG PO TABS: 4.0000 mg | ORAL_TABLET | Freq: Three times a day (TID) | ORAL | 0 refills | Status: DC | PRN

## 2019-03-17 NOTE — ED Provider Notes (Signed)
[facility]    CSN: [id number] Arrival date & time: 03/17/19  1202      History   Chief Complaint Chief Complaint  Patient presents with  . Nausea  . Dysuria   HPI  23 year old female presents with multiple complaints.  Patient reports that for the past 2 months she has had intermittent bloating, nausea.  States that her abdomen feels tight.  She has had recent constipation as well.  She reports rectal pain and passage of hard stool.  Also notes a decrease in appetite.  Additionally, patient endorses intermittent dysuria.  She states that this is more prominent when she does not have a lot of fluid intake.  She has history of UTI and has previously seen urology.  Lastly, patient reveals that she has ongoing depression and anxiety.  She has had suicidal ideation with no intent or plan.  She states that she feels depressed and also feels very anxious.  She feels worthless.  She has trouble going out as she is anxious being around others.  She has previously seen a therapist but has not done so recently.  Patient states that she has not been seen recently as she does not have insurance. No other complaints.  PMH, Surgical Hx, Family Hx, Social History reviewed and updated as below.  Past Medical History:  Diagnosis Date  . Abdominal pain   . Goiter   . Hypothyroidism, acquired, autoimmune   . Oligomenorrhea   . Thyroiditis, autoimmune     Patient Active Problem List   Diagnosis Date Noted  . Abdominal pain   . Hypothyroidism, acquired, autoimmune   . Thyroiditis, autoimmune   . Goiter   . Oligomenorrhea   . Hypothyroid 11/29/2010    Past Surgical History:  Procedure Laterality Date  . NO PAST SURGERIES      OB History   No obstetric history on file.      Home Medications    Prior to Admission medications   Medication Sig Start Date End Date Taking? Authorizing Provider  amphetamine-dextroamphetamine (ADDERALL) 20 MG tablet Take 20 mg by mouth daily.    Yes [provider]  ondansetron (ZOFRAN) 4 MG tablet Take 1 tablet (4 mg total) by mouth every 8 (eight) hours as needed for nausea or vomiting. 03/17/19   Tommie Samsook, Kyandre Okray G, DO  polyethylene glycol powder (GLYCOLAX/MIRALAX) 17 GM/SCOOP powder 17 g once or twice daily for constipation. 03/17/19   Tommie Samsook, Minard Millirons G, DO  levothyroxine (SYNTHROID, LEVOTHROID) 100 MCG tablet TAKE 1 TABLET (100 MCG TOTAL) BY MOUTH DAILY BEFORE BREAKFAST. 12/05/16 03/17/19  Romero BellingEllison, Sean, MD    Family History Family History  Problem Relation Age of Onset  . Thyroid disease Mother   . Diabetes Maternal Grandmother   . Cancer Maternal Grandmother   . Thyroid disease Maternal Grandmother     Social History Social History   Tobacco Use  . Smoking status: Former Games developermoker  . Smokeless tobacco: Never Used  Substance Use Topics  . Alcohol use: Yes    Comment: occasionally  . Drug use: Yes    Types: Marijuana     Allergies   Patient has no known allergies.   Review of Systems Review of Systems Per HPI  Physical Exam Triage Vital Signs ED Triage Vitals  Enc Vitals Group     BP 03/17/19 1236 128/80     Pulse Rate 03/17/19 1236 82     Resp 03/17/19 1236 18     Temp 03/17/19 1236 98.2  F (36.8 C)     Temp Source 03/17/19 1236 Oral     SpO2 03/17/19 1236 100 %     Weight 03/17/19 1227 160 lb (72.6 kg)     Height 03/17/19 1227 5\' 9"  (1.753 m)     Head Circumference --      Peak Flow --      Pain Score 03/17/19 1226 6     Pain Loc --      Pain Edu? --      Excl. in [place]? --    Updated Vital Signs BP 128/80 (BP Location: Left Arm)   Pulse 82   Temp 98.2 F (36.8 C) (Oral)   Resp 18   Ht 5\' 9"  (1.753 m)   Wt 72.6 kg   LMP 03/03/2019 (Approximate)   SpO2 100%   BMI 23.63 kg/m   Visual Acuity Right Eye Distance:   Left Eye Distance:   Bilateral Distance:    Right Eye Near:   Left Eye Near:    Bilateral Near:     Physical Exam Vitals signs and nursing note reviewed.  Constitutional:       General: She is not in acute distress.    Appearance: Normal appearance. She is normal weight.  HENT:     Head: Normocephalic and atraumatic.  Eyes:     General:        Right eye: No discharge.        Left eye: No discharge.     Conjunctiva/sclera: Conjunctivae normal.  Cardiovascular:     Rate and Rhythm: Normal rate and regular rhythm.     Heart sounds: No murmur.  Pulmonary:     Effort: Pulmonary effort is normal. No respiratory distress.     Breath sounds: Normal breath sounds. No wheezing, rhonchi or rales.  Abdominal:     General: There is no distension.     Palpations: Abdomen is soft.     Tenderness: There is no abdominal tenderness. There is no guarding or rebound.  Skin:    General: Skin is warm.     Findings: No rash.  Neurological:     Mental Status: She is alert.  Psychiatric:     Comments: Flat affect.  Depressed mood.    UC Treatments / Results  Labs (all labs ordered are listed, but only abnormal results are displayed) Labs Reviewed  URINALYSIS, COMPLETE (UACMP) WITH MICROSCOPIC - Abnormal; Notable for the following components:      Result Value   Color, Urine ORANGE (*)    Glucose, UA 100 (*)    Bilirubin Urine SMALL (*)    Ketones, ur TRACE (*)    Protein, ur 30 (*)    Nitrite POSITIVE (*)    Leukocytes,Ua TRACE (*)    Bacteria, UA MANY (*)    All other components within normal limits  URINE CULTURE    EKG   Radiology No results found.  Procedures Procedures (including critical care time)  Medications Ordered in UC Medications - No data to display  Initial Impression / Assessment and Plan / UC Course  I have reviewed the triage vital signs and the nursing notes.  Pertinent labs & imaging results that were available during my care of the patient were reviewed by me and considered in my medical decision making (see chart for details).    23 year old female presents with multiple complaints.  Patient appears to be experiencing  constipation.  Constipation likely impacting urinary symptoms.  Patient also clinically depressed  and anxious.  Zofran as needed for nausea.  MiraLAX for constipation.  Sending urine for culture.  Advised to contact recommended therapies.  Offered medication regarding her mood and patient declined.  She does not currently have a plan or intent to harm herself.  Advised to go to the ER if she develops a plan or intent.  Patient agreed.  Final Clinical Impressions(s) / UC Diagnoses   Final diagnoses:  Constipation, unspecified constipation type  Dysuria  Anxiety and depression     Discharge Instructions     Zofran for nausea. Miralax for constipation.  If culture is positive, we will call.  Contact [facility] - [phone]  Take care  Dr. Adriana Simas    ED Prescriptions    Medication Sig Dispense Auth. Provider   polyethylene glycol powder (GLYCOLAX/MIRALAX) 17 GM/SCOOP powder 17 g once or twice daily for constipation. 500 g Gerene Nedd G, DO   ondansetron (ZOFRAN) 4 MG tablet Take 1 tablet (4 mg total) by mouth every 8 (eight) hours as needed for nausea or vomiting. 20 tablet Tommie Sams, DO     Controlled Substance Prescriptions [place] Controlled Substance Registry consulted? Not Applicable   Tommie Sams, DO 03/17/19 1327

## 2019-03-17 NOTE — Discharge Instructions (Signed)
Zofran for nausea. Miralax for constipation.  If culture is positive, we will call.  Contact [facility] - [phone]  Take care  Dr. Lacinda Axon

## 2019-03-17 NOTE — ED Triage Notes (Signed)
Pt c/o nausea, bloating, dysuria, painful bowel movements. She states that this has been going on for months now but she has not had insurance to come be seen. She also states that she has been having a lot of anxiety and depression.

## 2019-03-19 LAB — URINE CULTURE: Culture: >=100000 — AB

## 2019-04-14 DIAGNOSIS — E063 Autoimmune thyroiditis: Secondary | ICD-10-CM | POA: Insufficient documentation

## 2019-09-16 ENCOUNTER — Ambulatory Visit: Payer: No Typology Code available for payment source | Attending: Internal Medicine

## 2019-10-04 ENCOUNTER — Encounter: Payer: Self-pay | Admitting: Emergency Medicine

## 2019-10-04 ENCOUNTER — Emergency Department
Admission: EM | Admit: 2019-10-04 | Discharge: 2019-10-04 | Disposition: A | Discharge status: Home / Self Care | Payer: Self-pay | Attending: Emergency Medicine | Admitting: Emergency Medicine

## 2019-10-04 ENCOUNTER — Other Ambulatory Visit: Payer: Self-pay

## 2019-10-04 DIAGNOSIS — Z87891 Personal history of nicotine dependence: Secondary | ICD-10-CM | POA: Insufficient documentation

## 2019-10-04 DIAGNOSIS — K122 Cellulitis and abscess of mouth: Secondary | ICD-10-CM | POA: Insufficient documentation

## 2019-10-04 DIAGNOSIS — E038 Other specified hypothyroidism: Secondary | ICD-10-CM | POA: Insufficient documentation

## 2019-10-04 DIAGNOSIS — Z79899 Other long term (current) drug therapy: Secondary | ICD-10-CM | POA: Insufficient documentation

## 2019-10-04 MED ORDER — LIDOCAINE HCL (PF) 1 % IJ SOLN
5.0000 mL | Freq: Once | INTRAMUSCULAR | Status: AC
  Administered 2019-10-04: 15:00:00 5 mL
  Filled 2019-10-04: qty 5

## 2019-10-04 MED ORDER — CEFTRIAXONE SODIUM 1 G IJ SOLR
1.0000 g | Freq: Once | INTRAMUSCULAR | Status: AC
  Administered 2019-10-04: 1 g via INTRAMUSCULAR
  Filled 2019-10-04: qty 10

## 2019-10-04 MED ORDER — LIDOCAINE VISCOUS HCL 2 % MT SOLN
15.0000 mL | Freq: Once | OROMUCOSAL | Status: AC
  Administered 2019-10-04: 15:00:00 15 mL via OROMUCOSAL
  Filled 2019-10-04: qty 15

## 2019-10-04 MED ORDER — AMOXICILLIN-POT CLAVULANATE 875-125 MG PO TABS: 1.0000 | ORAL_TABLET | Freq: Two times a day (BID) | ORAL | 0 refills | Status: AC

## 2019-10-04 NOTE — ED Notes (Signed)
See triage note  Presents with possible dental abscess   States she noticed pain and swelling to right lower gumline  Now having pain into jaw and right ear States  She poked her gum with a sharp instrument  Prior to having increased pain and swelling

## 2019-10-04 NOTE — ED Triage Notes (Signed)
Pt used her tweezers to try and get a "hair" from between teeth. punctured gums doing it and reports she found out her boyfriend had been using her tweezers in his nose r/t something with a deviated septum.  Pt c/o swelling to right jaw.  Airway intact. Handling secretions. VSS

## 2019-10-04 NOTE — Discharge Instructions (Signed)
I do not feel a drainable abscess in your cheek at this time.  Begin antibiotics tonight.  Please return to the emergency department if swelling continues to worsen while on antibiotics.  Use heat to your cheek.  Please follow-up with the dentist.

## 2019-10-04 NOTE — ED Provider Notes (Signed)
[facility] Emergency Department Provider Note  ____________________________________________  Time seen: Approximately 2:07 PM  I have reviewed the triage vital signs and the nursing notes.   HISTORY  Chief Complaint Facial Swelling    HPI Vanessa Zavala is a 24 y.o. female that presents to the emergency department for evaluation of pain and swelling to right cheek today after an injury 2 days ago.  Patient states that her tooth has been bothering her so she was picking at it.  She thought maybe something was stuck in it.  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  She grabbed a pair of sharp tweezers trying to pull any hair out and accidentally poked the inside of her right cheek.  Her boyfriend then told her that he uses those tweezers to tweeze the inside of his nose.  She did try to get into a dentist today but was referred to the emergency department.  Her tooth is still uncomfortable.  No fevers.  No neck swelling.  Past Medical History:  Diagnosis Date  . Abdominal pain   . Goiter   . Hypothyroidism, acquired, autoimmune   . Oligomenorrhea   . Thyroiditis, autoimmune     Patient Active Problem List   Diagnosis Date Noted  . Abdominal pain   . Hypothyroidism, acquired, autoimmune   . Thyroiditis, autoimmune   . Goiter   . Oligomenorrhea   . Hypothyroid 11/29/2010    Past Surgical History:  Procedure Laterality Date  . NO PAST SURGERIES      Prior to Admission medications   Medication Sig Start Date End Date Taking? Authorizing Provider  amoxicillin-clavulanate (AUGMENTIN) 875-125 MG tablet Take 1 tablet by mouth 2 (two) times daily for 10 days. 10/04/19 10/14/19  Laban Emperor, PA-C  amphetamine-dextroamphetamine (ADDERALL) 20 MG tablet Take 20 mg by mouth daily.    [provider]  ondansetron (ZOFRAN) 4 MG tablet Take 1 tablet (4 mg total) by mouth every 8 (eight) hours as  needed for nausea or vomiting. 03/17/19   Coral Spikes, DO  polyethylene glycol powder (GLYCOLAX/MIRALAX) 17 GM/SCOOP powder 17 g once or twice daily for constipation. 03/17/19   Coral Spikes, DO  levothyroxine (SYNTHROID, LEVOTHROID) 100 MCG tablet TAKE 1 TABLET (100 MCG TOTAL) BY MOUTH DAILY BEFORE BREAKFAST. 12/05/16 03/17/19  Renato Shin, MD    Allergies Patient has no known allergies.  Family History  Problem Relation Age of Onset  . Thyroid disease Mother   . Diabetes Maternal Grandmother   . Cancer Maternal Grandmother   . Thyroid disease Maternal Grandmother     Social History Social History   Tobacco Use  . Smoking status: Former Research scientist (life sciences)  . Smokeless tobacco: Never Used  Substance Use Topics  . Alcohol use: Yes    Comment: occasionally  . Drug use: Yes    Types: Marijuana     Review of Systems  Constitutional: No fever/chills Respiratory: No SOB. Gastrointestinal: No nausea, no vomiting.  Musculoskeletal: Negative for musculoskeletal pain. Skin: Negative for rash, abrasions, lacerations, ecchymosis. Neurological: Negative for headaches   ____________________________________________   PHYSICAL EXAM:  VITAL SIGNS: ED Triage Vitals  Enc Vitals Group     BP 10/04/19 1337 (!) 142/77     Pulse Rate 10/04/19 1337 64     Resp 10/04/19 1337 18     Temp 10/04/19 1337 98.3 F (36.8 C)     Temp Source 10/04/19  1337 Oral     SpO2 10/04/19 1337 100 %     Weight 10/04/19 1337 150 lb (68 kg)     Height 10/04/19 1337 5\' 9"  (1.753 m)     Head Circumference --      Peak Flow --      Pain Score 10/04/19 1341 8     Pain Loc --      Pain Edu? --      Excl. in GC? --      Constitutional: Alert and oriented. Well appearing and in no acute distress. Eyes: Conjunctivae are normal. PERRL. EOMI. Head: Atraumatic. ENT:      Ears:      Nose: No congestion/rhinnorhea.      Mouth/Throat: Mucous membranes are moist. Overall good dentition. Small defect to inside of right  cheek with some surrounding swelling. No fluctuance or palpable abscess. Neck: No stridor. No swelling or tenderness under chin. No neck swelling.  Cardiovascular: Normal rate, regular rhythm.  Good peripheral circulation. Respiratory: Normal respiratory effort without tachypnea or retractions. Lungs CTAB. Good air entry to the bases with no decreased or absent breath sounds. Musculoskeletal: Full range of motion to all extremities. No gross deformities appreciated. Neurologic:  Normal speech and language. No gross focal neurologic deficits are appreciated.  Skin:  Skin is warm, dry and intact. No rash noted. Psychiatric: Mood and affect are normal. Speech and behavior are normal. Patient exhibits appropriate insight and judgement.   ____________________________________________   LABS (all labs ordered are listed, but only abnormal results are displayed)  Labs Reviewed - No data to display ____________________________________________  EKG   ____________________________________________  RADIOLOGY  No results found.  ____________________________________________    PROCEDURES  Procedure(s) performed:    Procedures    Medications  cefTRIAXone (ROCEPHIN) injection 1 g (1 g Intramuscular Given 10/04/19 1449)  lidocaine (XYLOCAINE) 2 % viscous mouth solution 15 mL (15 mLs Mouth/Throat Given 10/04/19 1449)  lidocaine (PF) (XYLOCAINE) 1 % injection 5 mL (5 mLs Other Given 10/04/19 1449)     ____________________________________________   INITIAL IMPRESSION / ASSESSMENT AND PLAN / ED COURSE  Pertinent labs & imaging results that were available during my care of the patient were reviewed by me and considered in my medical decision making (see chart for details).  Review of the [place] CSRS was performed in accordance of the [facility] prior to dispensing any controlled drugs.    Patient presented to the emergency department for evaluation of infection to the inside of right cheek  after an injury 2 days ago.  Vital signs and exam are reassuring.  No indication of deep space infection.  No palpable drainable abscess.  Patient was given IM ceftriaxone to cover for infection.  Patient will be discharged home with prescriptions for Augmentin. Patient is to follow up with dentist or emergency department as directed. Patient is given ED precautions to return to the ED for any worsening or new symptoms.  Vanessa Zavala was evaluated in Emergency Department on 10/04/2019 for the symptoms described in the history of present illness. She was evaluated in the context of the global COVID-19 pandemic, which necessitated consideration that the patient might be at risk for infection with the SARS-CoV-2 virus that causes COVID-19. Institutional protocols and algorithms that pertain to the evaluation of patients at risk for COVID-19 are in a state of rapid change based on information released by regulatory bodies including the CDC and federal and state organizations. These policies and algorithms were followed during  the patient's care in the ED.  ____________________________________________  FINAL CLINICAL IMPRESSION(S) / ED DIAGNOSES  Final diagnoses:  Infection of mouth      NEW MEDICATIONS STARTED DURING THIS VISIT:  ED Discharge Orders         Ordered    amoxicillin-clavulanate (AUGMENTIN) 875-125 MG tablet  2 times daily     10/04/19 1457              This chart was dictated using voice recognition software/Dragon. Despite best efforts to proofread, errors can occur which can change the meaning. Any change was purely unintentional.    Enid Derry, PA-C 10/04/19 1516    Minna Antis, MD 10/04/19 [phone]

## 2019-10-29 ENCOUNTER — Emergency Department: Payer: Self-pay

## 2019-10-29 ENCOUNTER — Encounter: Payer: Self-pay | Admitting: Emergency Medicine

## 2019-10-29 ENCOUNTER — Emergency Department
Admission: EM | Admit: 2019-10-29 | Discharge: 2019-10-29 | Disposition: A | Discharge status: Home / Self Care | Payer: Self-pay | Attending: Student in an Organized Health Care Education/Training Program | Admitting: Student in an Organized Health Care Education/Training Program

## 2019-10-29 ENCOUNTER — Other Ambulatory Visit: Payer: Self-pay

## 2019-10-29 DIAGNOSIS — Z87891 Personal history of nicotine dependence: Secondary | ICD-10-CM | POA: Insufficient documentation

## 2019-10-29 DIAGNOSIS — Z79899 Other long term (current) drug therapy: Secondary | ICD-10-CM | POA: Insufficient documentation

## 2019-10-29 DIAGNOSIS — R1032 Left lower quadrant pain: Secondary | ICD-10-CM | POA: Insufficient documentation

## 2019-10-29 DIAGNOSIS — F1210 Cannabis abuse, uncomplicated: Secondary | ICD-10-CM | POA: Insufficient documentation

## 2019-10-29 DIAGNOSIS — E039 Hypothyroidism, unspecified: Secondary | ICD-10-CM | POA: Insufficient documentation

## 2019-10-29 LAB — CBC
HCT: 39.5 % (ref 36.0–46.0)
Hemoglobin: 13.4 g/dL (ref 12.0–15.0)
MCH: 29.9 pg (ref 26.0–34.0)
MCHC: 33.9 g/dL (ref 30.0–36.0)
MCV: 88.2 fL (ref 80.0–100.0)
Platelets: 329 10*3/uL (ref 150–400)
RBC: 4.48 MIL/uL (ref 3.87–5.11)
RDW: 13 % (ref 11.5–15.5)
WBC: 9.4 10*3/uL (ref 4.0–10.5)
nRBC: 0 % (ref 0.0–0.2)

## 2019-10-29 LAB — URINALYSIS, COMPLETE (UACMP) WITH MICROSCOPIC
Bilirubin Urine: NEGATIVE
Glucose, UA: NEGATIVE mg/dL
Hgb urine dipstick: NEGATIVE
Ketones, ur: NEGATIVE mg/dL
Nitrite: NEGATIVE
Protein, ur: NEGATIVE mg/dL
Specific Gravity, Urine: 1.015 (ref 1.005–1.030)
pH: 7 (ref 5.0–8.0)

## 2019-10-29 LAB — COMPREHENSIVE METABOLIC PANEL
ALT: 17 U/L (ref 0–44)
AST: 16 U/L (ref 15–41)
Albumin: 4.6 g/dL (ref 3.5–5.0)
Alkaline Phosphatase: 50 U/L (ref 38–126)
Anion gap: 8 (ref 5–15)
BUN: 11 mg/dL (ref 6–20)
CO2: 27 mmol/L (ref 22–32)
Calcium: 9.3 mg/dL (ref 8.9–10.3)
Chloride: 108 mmol/L (ref 98–111)
Creatinine, Ser: 0.8 mg/dL (ref 0.44–1.00)
GFR calc Af Amer: >60 mL/min (ref >60)
GFR calc non Af Amer: >60 mL/min (ref >60)
Glucose, Bld: 100 mg/dL — ABNORMAL HIGH (ref 70–99)
Potassium: 3.8 mmol/L (ref 3.5–5.1)
Sodium: 143 mmol/L (ref 135–145)
Total Bilirubin: 0.6 mg/dL (ref 0.3–1.2)
Total Protein: 7.2 g/dL (ref 6.5–8.1)

## 2019-10-29 LAB — POCT PREGNANCY, URINE: Preg Test, Ur: NEGATIVE

## 2019-10-29 LAB — LIPASE, BLOOD: Lipase: 22 U/L (ref 11–51)

## 2019-10-29 MED ORDER — ONDANSETRON HCL 4 MG PO TABS
4.0000 mg | ORAL_TABLET | Freq: Three times a day (TID) | ORAL | 0 refills | Status: DC | PRN
Start: 2019-10-29 — End: 2020-01-19

## 2019-10-29 MED ORDER — ACETAMINOPHEN 500 MG PO TABS
1000.0000 mg | ORAL_TABLET | Freq: Once | ORAL | Status: AC
  Administered 2019-10-29: 1000 mg via ORAL
  Filled 2019-10-29: qty 2

## 2019-10-29 MED ORDER — CEFTRIAXONE SODIUM 1 G IJ SOLR
1.0000 g | Freq: Once | INTRAMUSCULAR | Status: AC
  Administered 2019-10-29: 22:00:00 1 g via INTRAMUSCULAR
  Filled 2019-10-29: qty 10

## 2019-10-29 MED ORDER — SODIUM CHLORIDE 0.9% FLUSH: 3.0000 mL | Freq: Once | INTRAVENOUS | Status: DC

## 2019-10-29 NOTE — ED Triage Notes (Signed)
Pt to ED via POV stating that she was seen at oral surgeon office and was told that she needed to come to the ED. Pt states that she has an abscess on the right side of her jaw and they told her she needed IV antibiotics for it. Pt also states that she is having pain in her LLQ. Pt has not had a period in 2 months but states that she has had several negative pregnancy test. Pt is in NAD.

## 2019-10-29 NOTE — ED Provider Notes (Signed)
[facility] Emergency Department Provider Note    First MD Initiated Contact with Patient 10/29/19 2100     (approximate)  I have reviewed the triage vital signs and the nursing notes.   HISTORY  Chief Complaint Abscess and Abdominal Pain    HPI Vanessa Zavala is a 24 y.o. female below listed past medical history presents to the ER for evaluation of left lower quadrant pain that has been intermittent and bothering her for the past several months.  Initially stated in triage that she was also seen and dentist office today and being treated for odontogenic infection with oral antibiotics was told to come to the ER for dose of IV antibiotics.  She states that that is a minor complaint and states that she was not also worried about the abdominal pain she would not of come to the ER.  Denies any fevers.  No shortness of breath.  No vaginal discharge.      Past Medical History:  Diagnosis Date  . Abdominal pain   . Goiter   . Hypothyroidism, acquired, autoimmune   . Oligomenorrhea   . Thyroiditis, autoimmune    Family History  Problem Relation Age of Onset  . Thyroid disease Mother   . Diabetes Maternal Grandmother   . Cancer Maternal Grandmother   . Thyroid disease Maternal Grandmother    Past Surgical History:  Procedure Laterality Date  . NO PAST SURGERIES     Patient Active Problem List   Diagnosis Date Noted  . Abdominal pain   . Hypothyroidism, acquired, autoimmune   . Thyroiditis, autoimmune   . Goiter   . Oligomenorrhea   . Hypothyroid 11/29/2010      Prior to Admission medications   Medication Sig Start Date End Date Taking? Authorizing Provider  amphetamine-dextroamphetamine (ADDERALL) 20 MG tablet Take 20 mg by mouth daily.    [provider]  ondansetron (ZOFRAN) 4 MG tablet Take 1 tablet (4 mg total) by mouth every 8 (eight) hours as needed for nausea or vomiting. 10/29/19   Willy Eddy, MD  polyethylene glycol  powder (GLYCOLAX/MIRALAX) 17 GM/SCOOP powder 17 g once or twice daily for constipation. 03/17/19   Tommie Sams, DO  levothyroxine (SYNTHROID, LEVOTHROID) 100 MCG tablet TAKE 1 TABLET (100 MCG TOTAL) BY MOUTH DAILY BEFORE BREAKFAST. 12/05/16 03/17/19  Romero Belling, MD    Allergies Patient has no known allergies.    Social History Social History   Tobacco Use  . Smoking status: Former Games developer  . Smokeless tobacco: Never Used  Substance Use Topics  . Alcohol use: Yes    Comment: occasionally  . Drug use: Yes    Types: Marijuana    Review of Systems Patient denies headaches, rhinorrhea, blurry vision, numbness, shortness of breath, chest pain, edema, cough, abdominal pain, nausea, vomiting, diarrhea, dysuria, fevers, rashes or hallucinations unless otherwise stated above in HPI. ____________________________________________   PHYSICAL EXAM:  VITAL SIGNS: Vitals:   10/29/19 2103 10/29/19 2247  BP: 125/79 117/84  Pulse: (!) 57 70  Resp: 15 16  Temp:    SpO2: 100% 98%    Constitutional: Alert and oriented.  Eyes: Conjunctivae are normal.  Head: Atraumatic. Nose: No congestion/rhinnorhea. Mouth/Throat: Mucous membranes are moist.  No evidence of peritonsillar swelling abscess uvula is mid line.  No sublingual firmness or erythema.  There is no evidence of periapical abscess.  No tenderness over the parotid glands. Neck: No stridor. Painless ROM.  Cardiovascular: Normal rate, regular rhythm. Grossly  normal heart sounds.  Good peripheral circulation. Respiratory: Normal respiratory effort.  No retractions. Lungs CTAB. Gastrointestinal: Soft and nontender. No distention. No abdominal bruits. No CVA tenderness. Genitourinary:  Musculoskeletal: No lower extremity tenderness nor edema.  No joint effusions. Neurologic:  Normal speech and language. No gross focal neurologic deficits are appreciated. No facial droop Skin:  Skin is warm, dry and intact. No rash noted. Psychiatric: Mood  and affect are normal. Speech and behavior are normal.  ____________________________________________   LABS (all labs ordered are listed, but only abnormal results are displayed)  Results for orders placed or performed during the hospital encounter of 10/29/19 (from the past 24 hour(s))  Lipase, blood     Status: None   Collection Time: 10/29/19  5:32 PM  Result Value Ref Range   Lipase 22 11 - 51 U/L  Comprehensive metabolic panel     Status: Abnormal   Collection Time: 10/29/19  5:32 PM  Result Value Ref Range   Sodium 143 135 - 145 mmol/L   Potassium 3.8 3.5 - 5.1 mmol/L   Chloride 108 98 - 111 mmol/L   CO2 27 22 - 32 mmol/L   Glucose, Bld 100 (H) 70 - 99 mg/dL   BUN 11 6 - 20 mg/dL   Creatinine, Ser 0.80 0.44 - 1.00 mg/dL   Calcium 9.3 8.9 - 10.3 mg/dL   Total Protein 7.2 6.5 - 8.1 g/dL   Albumin 4.6 3.5 - 5.0 g/dL   AST 16 15 - 41 U/L   ALT 17 0 - 44 U/L   Alkaline Phosphatase 50 38 - 126 U/L   Total Bilirubin 0.6 0.3 - 1.2 mg/dL   GFR calc non Af Amer >60 >60 mL/min   GFR calc Af Amer >60 >60 mL/min   Anion gap 8 5 - 15  CBC     Status: None   Collection Time: 10/29/19  5:32 PM  Result Value Ref Range   WBC 9.4 4.0 - 10.5 K/uL   RBC 4.48 3.87 - 5.11 MIL/uL   Hemoglobin 13.4 12.0 - 15.0 g/dL   HCT 39.5 36.0 - 46.0 %   MCV 88.2 80.0 - 100.0 fL   MCH 29.9 26.0 - 34.0 pg   MCHC 33.9 30.0 - 36.0 g/dL   RDW 13.0 11.5 - 15.5 %   Platelets 329 150 - 400 K/uL   nRBC 0.0 0.0 - 0.2 %  Urinalysis, Complete w Microscopic     Status: Abnormal   Collection Time: 10/29/19  5:33 PM  Result Value Ref Range   Color, Urine YELLOW (A) YELLOW   APPearance TURBID (A) CLEAR   Specific Gravity, Urine 1.015 1.005 - 1.030   pH 7.0 5.0 - 8.0   Glucose, UA NEGATIVE NEGATIVE mg/dL   Hgb urine dipstick NEGATIVE NEGATIVE   Bilirubin Urine NEGATIVE NEGATIVE   Ketones, ur NEGATIVE NEGATIVE mg/dL   Protein, ur NEGATIVE NEGATIVE mg/dL   Nitrite NEGATIVE NEGATIVE   Leukocytes,Ua MODERATE  (A) NEGATIVE   RBC / HPF 6-10 0 - 5 RBC/hpf   WBC, UA 21-50 0 - 5 WBC/hpf   Bacteria, UA RARE (A) NONE SEEN   Squamous Epithelial / LPF 6-10 0 - 5   Mucus PRESENT    Amorphous Crystal PRESENT   Pregnancy, urine POC     Status: None   Collection Time: 10/29/19  5:36 PM  Result Value Ref Range   Preg Test, Ur NEGATIVE NEGATIVE   ____________________________________________ ____________________________________________  RADIOLOGY  I personally reviewed  all radiographic images ordered to evaluate for the above acute complaints and reviewed radiology reports and findings.  These findings were personally discussed with the patient.  Please see medical record for radiology report.  ____________________________________________   PROCEDURES  Procedure(s) performed:  Procedures    Critical Care performed: no ____________________________________________   INITIAL IMPRESSION / ASSESSMENT AND PLAN / ED COURSE  Pertinent labs & imaging results that were available during my care of the patient were reviewed by me and considered in my medical decision making (see chart for details).   DDX: PCOS, cyst, torsion, dental abscess, Ludwig's, cellulitis  CAROLIE MCILRATH is a 24 y.o. who presents to the ED with symptoms as described above.  She is afebrile hemodynamically stable.  She does have evidence of increased intention and she is followed up with dentistry.  There is no evidence of Ludwig's angina.  No identifiable abscess.  Will give dose of IM antibiotics.  She is tolerating p.o. antibiotics as prescribed.  Seems that her primary concern is wanting to be evaluated intermittent left lower quadrant abdominal pain.  Will order ultrasound.  Clinical Course as of Oct 29 2306  Fri Oct 29, 2019  2211 Patient reassessed.  We discussed results of her ultrasound..  Repeat exam is reassuring.  At this point he wishes stable and appropriate for outpatient follow-up.   [PR]    Clinical Course User  Index [PR] Willy Eddy, MD    The patient was evaluated in Emergency Department today for the symptoms described in the history of present illness. He/she was evaluated in the context of the global COVID-19 pandemic, which necessitated consideration that the patient might be at risk for infection with the SARS-CoV-2 virus that causes COVID-19. Institutional protocols and algorithms that pertain to the evaluation of patients at risk for COVID-19 are in a state of rapid change based on information released by regulatory bodies including the CDC and federal and state organizations. These policies and algorithms were followed during the patient's care in the ED.  As part of my medical decision making, I reviewed the following data within the electronic MEDICAL RECORD NUMBER Nursing notes reviewed and incorporated, Labs reviewed, notes from prior ED visits and [place] Controlled Substance Database   ____________________________________________   FINAL CLINICAL IMPRESSION(S) / ED DIAGNOSES  Final diagnoses:  Left lower quadrant abdominal pain      NEW MEDICATIONS STARTED DURING THIS VISIT:  Current Discharge Medication List       Note:  This document was prepared using Dragon voice recognition software and may include unintentional dictation errors.    Willy Eddy, MD 10/29/19 2308

## 2019-10-29 NOTE — Discharge Instructions (Addendum)

## 2020-01-14 DIAGNOSIS — Z3689 Encounter for other specified antenatal screening: Secondary | ICD-10-CM | POA: Diagnosis not present

## 2020-01-19 ENCOUNTER — Other Ambulatory Visit: Payer: Self-pay

## 2020-01-19 ENCOUNTER — Inpatient Hospital Stay (HOSPITAL_COMMUNITY): Payer: Medicaid Other

## 2020-01-19 ENCOUNTER — Encounter (HOSPITAL_COMMUNITY): Payer: Self-pay | Admitting: Obstetrics and Gynecology

## 2020-01-19 ENCOUNTER — Inpatient Hospital Stay (HOSPITAL_COMMUNITY)
Admission: AD | Admit: 2020-01-19 | Discharge: 2020-01-19 | Disposition: A | Discharge status: Home / Self Care | Payer: Medicaid Other | Attending: Obstetrics and Gynecology | Admitting: Obstetrics and Gynecology

## 2020-01-19 DIAGNOSIS — O26891 Other specified pregnancy related conditions, first trimester: Secondary | ICD-10-CM | POA: Insufficient documentation

## 2020-01-19 DIAGNOSIS — O99281 Endocrine, nutritional and metabolic diseases complicating pregnancy, first trimester: Secondary | ICD-10-CM | POA: Insufficient documentation

## 2020-01-19 DIAGNOSIS — R109 Unspecified abdominal pain: Secondary | ICD-10-CM

## 2020-01-19 DIAGNOSIS — E063 Autoimmune thyroiditis: Secondary | ICD-10-CM | POA: Insufficient documentation

## 2020-01-19 DIAGNOSIS — E282 Polycystic ovarian syndrome: Secondary | ICD-10-CM | POA: Insufficient documentation

## 2020-01-19 DIAGNOSIS — Z8349 Family history of other endocrine, nutritional and metabolic diseases: Secondary | ICD-10-CM | POA: Insufficient documentation

## 2020-01-19 DIAGNOSIS — R1030 Lower abdominal pain, unspecified: Secondary | ICD-10-CM | POA: Diagnosis not present

## 2020-01-19 DIAGNOSIS — O99891 Other specified diseases and conditions complicating pregnancy: Secondary | ICD-10-CM | POA: Diagnosis not present

## 2020-01-19 DIAGNOSIS — Z87891 Personal history of nicotine dependence: Secondary | ICD-10-CM | POA: Insufficient documentation

## 2020-01-19 DIAGNOSIS — O26899 Other specified pregnancy related conditions, unspecified trimester: Secondary | ICD-10-CM

## 2020-01-19 DIAGNOSIS — O99611 Diseases of the digestive system complicating pregnancy, first trimester: Secondary | ICD-10-CM | POA: Diagnosis not present

## 2020-01-19 DIAGNOSIS — O3680X0 Pregnancy with inconclusive fetal viability, not applicable or unspecified: Secondary | ICD-10-CM

## 2020-01-19 DIAGNOSIS — Z3A01 Less than 8 weeks gestation of pregnancy: Secondary | ICD-10-CM | POA: Diagnosis not present

## 2020-01-19 DIAGNOSIS — M545 Low back pain: Secondary | ICD-10-CM | POA: Diagnosis not present

## 2020-01-19 DIAGNOSIS — Z79899 Other long term (current) drug therapy: Secondary | ICD-10-CM | POA: Diagnosis not present

## 2020-01-19 DIAGNOSIS — K5900 Constipation, unspecified: Secondary | ICD-10-CM | POA: Insufficient documentation

## 2020-01-19 DIAGNOSIS — Z6741 Type O blood, Rh negative: Secondary | ICD-10-CM

## 2020-01-19 HISTORY — DX: Polycystic ovarian syndrome: E28.2

## 2020-01-19 HISTORY — DX: Autoimmune thyroiditis: E06.3

## 2020-01-19 LAB — URINALYSIS, ROUTINE W REFLEX MICROSCOPIC
Bilirubin Urine: NEGATIVE
Glucose, UA: NEGATIVE mg/dL
Hgb urine dipstick: NEGATIVE
Ketones, ur: NEGATIVE mg/dL
Leukocytes,Ua: NEGATIVE
Nitrite: NEGATIVE
Protein, ur: NEGATIVE mg/dL
Specific Gravity, Urine: 1.015 (ref 1.005–1.030)
pH: 6 (ref 5.0–8.0)

## 2020-01-19 LAB — CBC WITH DIFFERENTIAL/PLATELET
Abs Immature Granulocytes: 0.04 10*3/uL (ref 0.00–0.07)
Basophils Absolute: 0 10*3/uL (ref 0.0–0.1)
Basophils Relative: 0 %
Eosinophils Absolute: 0.1 10*3/uL (ref 0.0–0.5)
Eosinophils Relative: 1 %
HCT: 36.2 % (ref 36.0–46.0)
Hemoglobin: 12 g/dL (ref 12.0–15.0)
Immature Granulocytes: 0 %
Lymphocytes Relative: 28 %
Lymphs Abs: 2.9 10*3/uL (ref 0.7–4.0)
MCH: 30 pg (ref 26.0–34.0)
MCHC: 33.1 g/dL (ref 30.0–36.0)
MCV: 90.5 fL (ref 80.0–100.0)
Monocytes Absolute: 0.7 10*3/uL (ref 0.1–1.0)
Monocytes Relative: 6 %
Neutro Abs: 6.4 10*3/uL (ref 1.7–7.7)
Neutrophils Relative %: 65 %
Platelets: 324 10*3/uL (ref 150–400)
RBC: 4 MIL/uL (ref 3.87–5.11)
RDW: 13.4 % (ref 11.5–15.5)
WBC: 10.1 10*3/uL (ref 4.0–10.5)
nRBC: 0 % (ref 0.0–0.2)

## 2020-01-19 LAB — COMPREHENSIVE METABOLIC PANEL
ALT: 16 U/L (ref 0–44)
AST: 15 U/L (ref 15–41)
Albumin: 4 g/dL (ref 3.5–5.0)
Alkaline Phosphatase: 47 U/L (ref 38–126)
Anion gap: 7 (ref 5–15)
BUN: 7 mg/dL (ref 6–20)
CO2: 27 mmol/L (ref 22–32)
Calcium: 9.2 mg/dL (ref 8.9–10.3)
Chloride: 107 mmol/L (ref 98–111)
Creatinine, Ser: 0.65 mg/dL (ref 0.44–1.00)
GFR calc Af Amer: >60 mL/min (ref >60)
GFR calc non Af Amer: >60 mL/min (ref >60)
Glucose, Bld: 98 mg/dL (ref 70–99)
Potassium: 3.7 mmol/L (ref 3.5–5.1)
Sodium: 141 mmol/L (ref 135–145)
Total Bilirubin: 0.5 mg/dL (ref 0.3–1.2)
Total Protein: 6.3 g/dL — ABNORMAL LOW (ref 6.5–8.1)

## 2020-01-19 LAB — HCG, QUANTITATIVE, PREGNANCY: hCG, Beta Chain, Quant, S: 1568 m[IU]/mL — ABNORMAL HIGH (ref <5)

## 2020-01-19 LAB — ABO/RH
ABO/RH(D): O NEG
Antibody Screen: NEGATIVE

## 2020-01-19 LAB — WET PREP, GENITAL
Clue Cells Wet Prep HPF POC: NONE SEEN
Sperm: NONE SEEN
Trich, Wet Prep: NONE SEEN
Yeast Wet Prep HPF POC: NONE SEEN

## 2020-01-19 LAB — HIV ANTIBODY (ROUTINE TESTING W REFLEX): HIV Screen 4th Generation wRfx: NONREACTIVE

## 2020-01-19 LAB — POCT PREGNANCY, URINE: Preg Test, Ur: POSITIVE — AB

## 2020-01-19 NOTE — MAU Note (Signed)
Found out preg (7/8) after her [place] trip.  Today has been hurting pretty bad, lower abd and low back. No bleeding. Hx of PCOS, was told probably wouldn't be able to get pregnant.

## 2020-01-19 NOTE — MAU Provider Note (Signed)
History     CSN: [id number]  Arrival date and time: 01/19/20 1627   None     Chief Complaint  Patient presents with  . Abdominal Pain  . Vaginal Discharge  . Possible Pregnancy   HPI   Ms.Vanessa Zavala is a 24 y.o. female G1P0 @ [redacted]w[redacted]d with a history of PCOS here with lower abdominal pain and lower back pain. The pain started this morning around 0600. The pain is constant. She took Gas X which did not help at all. No vaginal bleeding. States she has had really bad constipation and noticed some blood in her stool yesterday. The pain is all along her lower abdomen. She currently rates her pain 7/10.  She does not have insurance at this time and was going to [facility] for labs only and paying out of pocket. She states she doesn't think she can continue paying out of pocket for visits.   OB History    Gravida  1   Para      Term      Preterm      AB      Living        SAB      TAB      Ectopic      Multiple      Live Births              Past Medical History:  Diagnosis Date  . Abdominal pain   . Goiter   . Hashimoto's disease   . Hypothyroidism, acquired, autoimmune   . Oligomenorrhea   . PCOS (polycystic ovarian syndrome)   . Thyroiditis, autoimmune     Past Surgical History:  Procedure Laterality Date  . NO PAST SURGERIES      Family History  Problem Relation Age of Onset  . Thyroid disease Mother   . Diabetes Maternal Grandmother   . Cancer Maternal Grandmother   . Thyroid disease Maternal Grandmother     Social History   Tobacco Use  . Smoking status: Former Games developer  . Smokeless tobacco: Never Used  Vaping Use  . Vaping Use: Every day  Substance Use Topics  . Alcohol use: Yes    Comment: occasionally  . Drug use: Yes    Types: Marijuana    Allergies:  Allergies  Allergen Reactions  . Eggs Or Egg-Derived Products     Medications Prior to Admission  Medication Sig Dispense Refill Last Dose  .  amphetamine-dextroamphetamine (ADDERALL) 20 MG tablet Take 20 mg by mouth daily.   Past Month at Unknown time  . Prenatal Vit-Fe Fumarate-FA (PRENATAL MULTIVITAMIN) TABS tablet Take 1 tablet by mouth daily at 12 noon.   01/19/2020 at Unknown time  . simethicone (MYLICON) 125 MG chewable tablet Chew 125 mg by mouth every 6 (six) hours as needed for flatulence.   01/19/2020 at Unknown time  . ondansetron (ZOFRAN) 4 MG tablet Take 1 tablet (4 mg total) by mouth every 8 (eight) hours as needed for nausea or vomiting. 20 tablet 0   . polyethylene glycol powder (GLYCOLAX/MIRALAX) 17 GM/SCOOP powder 17 g once or twice daily for constipation. 500 g 0    Results for orders placed or performed during the hospital encounter of 01/19/20 (from the past 48 hour(s))  Urinalysis, Routine w reflex microscopic     Status: None   Collection Time: 01/19/20  5:05 PM  Result Value Ref Range   Color, Urine YELLOW YELLOW   APPearance CLEAR CLEAR  Specific Gravity, Urine 1.015 1.005 - 1.030   pH 6.0 5.0 - 8.0   Glucose, UA NEGATIVE NEGATIVE mg/dL   Hgb urine dipstick NEGATIVE NEGATIVE   Bilirubin Urine NEGATIVE NEGATIVE   Ketones, ur NEGATIVE NEGATIVE mg/dL   Protein, ur NEGATIVE NEGATIVE mg/dL   Nitrite NEGATIVE NEGATIVE   Leukocytes,Ua NEGATIVE NEGATIVE    Comment: Performed at [facility], [address] [place], [place] [address]  Pregnancy, urine POC     Status: Abnormal   Collection Time: 01/19/20  5:06 PM  Result Value Ref Range   Preg Test, Ur POSITIVE (A) NEGATIVE    Comment:        THE SENSITIVITY OF THIS METHODOLOGY IS >24 mIU/mL   Wet prep, genital     Status: Abnormal   Collection Time: 01/19/20  6:30 PM  Result Value Ref Range   Yeast Wet Prep HPF POC NONE SEEN NONE SEEN   Trich, Wet Prep NONE SEEN NONE SEEN   Clue Cells Wet Prep HPF POC NONE SEEN NONE SEEN   WBC, Wet Prep HPF POC FEW (A) NONE SEEN   Sperm NONE SEEN     Comment: Performed at [facility], [address]  [place], [place] [address]  CBC with Differential/Platelet     Status: None   Collection Time: 01/19/20  6:46 PM  Result Value Ref Range   WBC 10.1 4.0 - 10.5 K/uL   RBC 4.00 3.87 - 5.11 MIL/uL   Hemoglobin 12.0 12.0 - 15.0 g/dL   HCT 09.3 36 - 46 %   MCV 90.5 80.0 - 100.0 fL   MCH 30.0 26.0 - 34.0 pg   MCHC 33.1 30.0 - 36.0 g/dL   RDW 26.7 12.4 - 58.0 %   Platelets 324 150 - 400 K/uL   nRBC 0.0 0.0 - 0.2 %   Neutrophils Relative % 65 %   Neutro Abs 6.4 1.7 - 7.7 K/uL   Lymphocytes Relative 28 %   Lymphs Abs 2.9 0.7 - 4.0 K/uL   Monocytes Relative 6 %   Monocytes Absolute 0.7 0 - 1 K/uL   Eosinophils Relative 1 %   Eosinophils Absolute 0.1 0 - 0 K/uL   Basophils Relative 0 %   Basophils Absolute 0.0 0 - 0 K/uL   Immature Granulocytes 0 %   Abs Immature Granulocytes 0.04 0.00 - 0.07 K/uL    Comment: Performed at [facility], [address] [place], [place] [address]  Comprehensive metabolic panel     Status: Abnormal   Collection Time: 01/19/20  6:46 PM  Result Value Ref Range   Sodium 141 135 - 145 mmol/L   Potassium 3.7 3.5 - 5.1 mmol/L   Chloride 107 98 - 111 mmol/L   CO2 27 22 - 32 mmol/L   Glucose, Bld 98 70 - 99 mg/dL    Comment: Glucose reference range applies only to samples taken after fasting for at least 8 hours.   BUN 7 6 - 20 mg/dL   Creatinine, Ser 8.25 0.44 - 1.00 mg/dL   Calcium 9.2 8.9 - 05.3 mg/dL   Total Protein 6.3 (L) 6.5 - 8.1 g/dL   Albumin 4.0 3.5 - 5.0 g/dL   AST 15 15 - 41 U/L   ALT 16 0 - 44 U/L   Alkaline Phosphatase 47 38 - 126 U/L   Total Bilirubin 0.5 0.3 - 1.2 mg/dL   GFR calc non Af Amer >60 >60 mL/min  GFR calc Af Amer >60 >60 mL/min   Anion gap 7 5 - 15    Comment: Performed at [facility], [address] [place], [place] [address]  ABO/Rh     Status: None   Collection Time: 01/19/20  6:46 PM  Result Value Ref Range   ABO/RH(D) O NEG    Antibody Screen      NEG Performed at [facility], [address],  [place], [place] [address]   HIV Antibody (routine testing w rflx)     Status: None   Collection Time: 01/19/20  6:46 PM  Result Value Ref Range   HIV Screen 4th Generation wRfx Non Reactive Non Reactive    Comment: Performed at [facility], [address] [place], [place] [address]  hCG, quantitative, pregnancy     Status: Abnormal   Collection Time: 01/19/20  6:46 PM  Result Value Ref Range   hCG, Beta Chain, Quant, S 1,568 (H) <5 mIU/mL    Comment:          GEST. AGE      CONC.  (mIU/mL)   <=1 WEEK        5 - 50     2 WEEKS       50 - 500     3 WEEKS       100 - 10,000     4 WEEKS     1,000 - 30,000     5 WEEKS     3,500 - 115,000   6-8 WEEKS     12,000 - 270,000    12 WEEKS     15,000 - 220,000        FEMALE AND NON-PREGNANT FEMALE:     LESS THAN 5 mIU/mL Performed at [facility], [address] [place], [place] [address]    US OB LESS THAN 14 WEEKS WITH OB TRANSVAGINAL  Result Date: 01/19/2020 CLINICAL DATA:  Initial evaluation for acute lower abdominal and back pain. Early pregnancy. EXAM: OBSTETRIC <14 WK [place] AND TRANSVAGINAL OB US TECHNIQUE: Both transabdominal and transvaginal ultrasound examinations were performed for complete evaluation of the gestation as well as the maternal uterus, adnexal regions, and pelvic cul-de-sac. Transvaginal technique was performed to assess early pregnancy. COMPARISON:  Prior ultrasound from 10/29/2019. FINDINGS: Intrauterine gestational sac: Single Yolk sac:  Not definitely visualized at this time. Embryo:  Negative. Cardiac Activity: Negative. MSD: 4.0 mm   5 w   1 d Subchorionic hemorrhage:  None visualized. Maternal uterus/adnexae: Ovaries are within normal limits bilaterally. Small corpus luteal cyst noted on the right. No free fluid or adnexal mass. IMPRESSION: 1. Probable early intrauterine gestational sac, but no definite yolk sac, fetal pole, or cardiac activity yet visualized. Recommend follow-up quantitative B-HCG levels and  follow-up US in 14 days to confirm and assess viability. This recommendation follows SRU consensus guidelines: Diagnostic Criteria for Nonviable Pregnancy Early in the First Trimester. Malva Limes Med 2013; 448:1856-31. 2. No other acute maternal uterine or adnexal abnormality identified. Electronically Signed   By: Rise Mu M.D.   On: 01/19/2020 19:49   Review of Systems  Constitutional: Negative for fever.  Gastrointestinal: Positive for abdominal pain, constipation and nausea. Negative for vomiting.  Genitourinary: Positive for vaginal discharge. Negative for dysuria and vaginal bleeding.   Physical Exam   Blood pressure 106/60, pulse 72, temperature 98.6 F (37 C), temperature source Oral, resp. rate 18, height 5\' 10"  (1.778 m),  weight 69.3 kg, last menstrual period 11/25/2019, SpO2 98 %.  Physical Exam Vitals and nursing note reviewed.  HENT:     Head: Normocephalic.  Abdominal:     Palpations: Abdomen is soft.     Tenderness: There is generalized abdominal tenderness. There is no rebound.  Genitourinary:    Comments: Wet prep and GC collected by RN  Skin:    General: Skin is warm.  Neurological:     General: No focal deficit present.     Mental Status: She is alert.  Psychiatric:        Mood and Affect: Mood normal.    MAU Course  Procedures  None  MDM  Wet prep & GC HIV, CBC, Hcg, ABO US OB transvaginal  RH negative blood type: patient is without bleeding today.   Assessment and Plan   A:  1. Pregnancy of unknown anatomic location   2. Abdominal pain in pregnancy   3. Type O blood, Rh negative     P:  Discharge home in stable condition Return to MAU on Sat morning for repeat quant. Will plan to repeat US next Thursday or Friday Return to MAU sooner if symptoms worsen Pelvic rest Ectopic precautions Continue prenatal vitamins   Deontez Klinke, Harolyn RutherfordJennifer I, NP 01/20/2020 8:19 AM

## 2020-01-20 LAB — GC/CHLAMYDIA PROBE AMP (~~LOC~~) NOT AT ARMC
Chlamydia: NEGATIVE
Comment: NEGATIVE
Comment: NORMAL
Neisseria Gonorrhea: NEGATIVE

## 2020-01-22 ENCOUNTER — Inpatient Hospital Stay (HOSPITAL_COMMUNITY)
Admission: AD | Admit: 2020-01-22 | Discharge: 2020-01-22 | Disposition: A | Discharge status: Home / Self Care | Payer: Medicaid Other | Attending: Obstetrics and Gynecology | Admitting: Obstetrics and Gynecology

## 2020-01-22 ENCOUNTER — Other Ambulatory Visit: Payer: Self-pay

## 2020-01-22 DIAGNOSIS — Z3491 Encounter for supervision of normal pregnancy, unspecified, first trimester: Secondary | ICD-10-CM | POA: Diagnosis not present

## 2020-01-22 DIAGNOSIS — O3680X0 Pregnancy with inconclusive fetal viability, not applicable or unspecified: Secondary | ICD-10-CM

## 2020-01-22 DIAGNOSIS — E349 Endocrine disorder, unspecified: Secondary | ICD-10-CM

## 2020-01-22 DIAGNOSIS — Z3A08 8 weeks gestation of pregnancy: Secondary | ICD-10-CM | POA: Insufficient documentation

## 2020-01-22 DIAGNOSIS — Z3A01 Less than 8 weeks gestation of pregnancy: Secondary | ICD-10-CM

## 2020-01-22 LAB — HCG, QUANTITATIVE, PREGNANCY: hCG, Beta Chain, Quant, S: 5541 m[IU]/mL — ABNORMAL HIGH (ref <5)

## 2020-01-22 NOTE — MAU Provider Note (Signed)
History   Chief Complaint:  Follow-up  Vanessa Zavala is  24 y.o. G1P0 Patient's last menstrual period was 11/25/2019 (approximate).. Patient is here for follow up of quantitative HCG and ongoing surveillance of pregnancy status. She is [redacted]w[redacted]d weeks gestation unsure by LMP.    Since her last visit, the patient is without new complaint. The patient reports bleeding as  none now.  She denies any pain.  General ROS:  negative  Her previous Quantitative HCG values are: Results for NELLINE, LIO (MRN [id number]) as of 01/22/2020 10:29  Ref. Range 01/19/2020 18:46  HCG, Beta Chain, Quant, S Latest Ref Range: <5 mIU/mL 1,568 (H)   Physical Exam   Blood pressure 122/77, pulse (!) 55, temperature 97.6 F (36.4 C), temperature source Oral, resp. rate 16, last menstrual period 11/25/2019, SpO2 100 %.  Focused Gynecological Exam: examination not indicated  Labs: Results for orders placed or performed during the hospital encounter of 01/22/20 (from the past 24 hour(s))  hCG, quantitative, pregnancy   Collection Time: 01/22/20  8:05 AM  Result Value Ref Range   hCG, Beta Chain, Quant, S 5,541 (H) <5 mIU/mL   Assessment:   1. Normal intrauterine pregnancy on prenatal ultrasound in first trimester   2. Elevated serum hCG     -Appropriate rise in HCG -Patient unsure where she is planning to follow up. Discussed repeat u/s in 10 days at [facility] and list of [facility] providers given to patient  Plan: -Discharge home in stable condition -First trimester precautions discussed -Patient advised to follow-up with [facility] for repeat u/s, order placed -Patient may return to MAU as needed or if her condition were to change or worsen  Rolm Bookbinder, CNM 01/22/2020, 9:26 AM

## 2020-01-22 NOTE — MAU Note (Signed)
Vanessa Zavala is a 24 y.o. at [redacted]w[redacted]d here in MAU reporting: here for follow up hcg. Denies pain or bleeding.   Pain score: 0/10  Vitals:   01/22/20 0755  BP: 122/77  Pulse: (!) 55  Resp: 16  Temp: 97.6 F (36.4 C)  SpO2: 100%     Lab orders placed from triage: hcg

## 2020-01-22 NOTE — Discharge Instructions (Signed)
Prenatal Care Providers           [facility]   Phone: [phone]  [facility]   Phone: [phone]  [facility]       Phone: [phone]            [facility] @ [place]     Phone: [phone]          [facility] [place]   Phone: [phone]  [facility]  Phone: [phone]  [facility] Phone: [phone]     [facility]  Phone: [phone]  [facility]  Phone: [phone]  [facility] Phone: [phone]  [facility] Phone: [phone]  [facility] Phone: [phone]  [facility] Phone: [phone]  [facility]  Phone: [phone] [facility] Prenatal Care Providers          [facility] locations:  Hours may vary. Please call for an appointment  [facility] [place]  [address]  [phone]  [facility]   [address]  [phone]  [facility] [place]       [address] [phone]            [facility] [place]     [address] [phone]          [facility] [place]   [address] [phone]  [facility]  [address] [phone]     [facility] ([place])  [address]   [phone]  Safe Medications in Pregnancy   Acne: Benzoyl Peroxide Salicylic Acid  Backache/Headache: Tylenol: 2 regular strength every 4 hours OR              2 Extra strength every 6 hours  Colds/Coughs/Allergies: Benadryl (alcohol free) 25 mg every 6 hours as needed Breath  right strips Claritin Cepacol throat lozenges Chloraseptic throat spray Cold-Eeze- up to three times per day Cough drops, alcohol free Flonase (by prescription only) Guaifenesin Mucinex Robitussin DM (plain only, alcohol free) Saline nasal spray/drops Sudafed (pseudoephedrine) & Actifed ** use only after [redacted] weeks gestation and if you do not have high blood pressure Tylenol Vicks Vaporub Zinc lozenges Zyrtec   Constipation: Colace Ducolax suppositories Fleet enema Glycerin suppositories Metamucil Milk of magnesia Miralax Senokot Smooth move tea  Diarrhea: Kaopectate Imodium A-D  *NO pepto Bismol  Hemorrhoids: Anusol Anusol HC Preparation H Tucks  Indigestion: Tums Maalox Mylanta Zantac  Pepcid  Insomnia: Benadryl (alcohol free) 25mg every 6 hours as needed Tylenol PM Unisom, no Gelcaps  Leg Cramps: Tums MagGel  Nausea/Vomiting:  Bonine Dramamine Emetrol Ginger extract Sea bands Meclizine  Nausea medication to take during pregnancy:  Unisom (doxylamine succinate 25 mg tablets) Take one tablet daily at bedtime. If symptoms are not adequately controlled, the dose can be increased to a maximum recommended dose of two tablets daily (1/2 tablet in the morning, 1/2 tablet mid-afternoon and one at bedtime). Vitamin B6 100mg tablets. Take one tablet twice a day (up to 200 mg per day).  Skin Rashes: Aveeno products Benadryl cream or 25mg every 6 hours   as needed Calamine Lotion 1% cortisone cream  Yeast infection: Gyne-lotrimin 7 Monistat 7   **If taking multiple medications, please check labels to avoid duplicating the same active ingredients **take medication as directed on the label ** Do not exceed 4000 mg of tylenol in 24 hours **Do not take medications that contain aspirin or ibuprofen     

## 2020-02-01 ENCOUNTER — Encounter: Payer: Self-pay | Admitting: Obstetrics and Gynecology

## 2020-02-01 ENCOUNTER — Ambulatory Visit (INDEPENDENT_AMBULATORY_CARE_PROVIDER_SITE_OTHER): Payer: Medicaid Other

## 2020-02-01 ENCOUNTER — Other Ambulatory Visit: Payer: Self-pay

## 2020-02-01 ENCOUNTER — Ambulatory Visit
Admission: RE | Admit: 2020-02-01 | Discharge: 2020-02-01 | Disposition: A | Discharge status: Home / Self Care | Payer: Medicaid Other | Source: Ambulatory Visit

## 2020-02-01 DIAGNOSIS — Z3491 Encounter for supervision of normal pregnancy, unspecified, first trimester: Secondary | ICD-10-CM | POA: Insufficient documentation

## 2020-02-01 DIAGNOSIS — O3680X0 Pregnancy with inconclusive fetal viability, not applicable or unspecified: Secondary | ICD-10-CM

## 2020-02-01 NOTE — Progress Notes (Signed)
Pt here today for OB U/S results for viability.  Pt denies pain or vaginal bleeding. Reviewed results with Dr. Jolayne Panther.  Pt informed that she has a good pregnancy with EDD 09/21/2020, 6w 5d today, and FHR 119.  Medications/allergies reviewed.  List of medications safe to take in pregnancy given.  Proof of pregnancy letter provided by the front office to start OB care.    Addison Naegeli, RN  02/01/20

## 2020-02-02 NOTE — Progress Notes (Signed)
Patient was assessed and managed by nursing staff during this encounter. I have reviewed the chart and agree with the documentation and plan. I have also made any necessary editorial changes.  Wilbur Oakland, MD 02/02/2020 5:41 PM    

## 2020-02-03 DIAGNOSIS — Z20822 Contact with and (suspected) exposure to covid-19: Secondary | ICD-10-CM | POA: Diagnosis not present

## 2020-02-23 DIAGNOSIS — Z3490 Encounter for supervision of normal pregnancy, unspecified, unspecified trimester: Secondary | ICD-10-CM

## 2020-02-25 DIAGNOSIS — Z114 Encounter for screening for human immunodeficiency virus [HIV]: Secondary | ICD-10-CM | POA: Diagnosis not present

## 2020-02-25 DIAGNOSIS — R875 Abnormal microbiological findings in specimens from female genital organs: Secondary | ICD-10-CM | POA: Diagnosis not present

## 2020-02-25 DIAGNOSIS — Z3481 Encounter for supervision of other normal pregnancy, first trimester: Secondary | ICD-10-CM | POA: Diagnosis not present

## 2020-02-25 DIAGNOSIS — Z8639 Personal history of other endocrine, nutritional and metabolic disease: Secondary | ICD-10-CM | POA: Diagnosis not present

## 2020-02-25 DIAGNOSIS — R87612 Low grade squamous intraepithelial lesion on cytologic smear of cervix (LGSIL): Secondary | ICD-10-CM | POA: Diagnosis not present

## 2020-02-25 LAB — OB RESULTS CONSOLE HIV ANTIBODY (ROUTINE TESTING): HIV: NONREACTIVE

## 2020-02-25 LAB — OB RESULTS CONSOLE RPR: RPR: NONREACTIVE

## 2020-02-25 LAB — OB RESULTS CONSOLE HEPATITIS B SURFACE ANTIGEN: Hepatitis B Surface Ag: NEGATIVE

## 2020-02-25 LAB — OB RESULTS CONSOLE VARICELLA ZOSTER ANTIBODY, IGG: Varicella: IMMUNE

## 2020-02-25 LAB — OB RESULTS CONSOLE RUBELLA ANTIBODY, IGM: Rubella: NON-IMMUNE/NOT IMMUNE

## 2020-02-28 DIAGNOSIS — R946 Abnormal results of thyroid function studies: Secondary | ICD-10-CM | POA: Diagnosis not present

## 2020-03-01 DIAGNOSIS — N898 Other specified noninflammatory disorders of vagina: Secondary | ICD-10-CM | POA: Diagnosis not present

## 2020-03-14 ENCOUNTER — Encounter (HOSPITAL_COMMUNITY): Payer: Self-pay

## 2020-03-14 ENCOUNTER — Other Ambulatory Visit: Payer: Self-pay

## 2020-03-14 ENCOUNTER — Ambulatory Visit (HOSPITAL_COMMUNITY)
Admission: EM | Admit: 2020-03-14 | Discharge: 2020-03-14 | Disposition: A | Discharge status: Home / Self Care | Payer: Medicaid Other | Attending: Physician Assistant | Admitting: Physician Assistant

## 2020-03-14 DIAGNOSIS — Z331 Pregnant state, incidental: Secondary | ICD-10-CM | POA: Insufficient documentation

## 2020-03-14 DIAGNOSIS — E282 Polycystic ovarian syndrome: Secondary | ICD-10-CM | POA: Insufficient documentation

## 2020-03-14 DIAGNOSIS — Z79899 Other long term (current) drug therapy: Secondary | ICD-10-CM | POA: Insufficient documentation

## 2020-03-14 DIAGNOSIS — Z87891 Personal history of nicotine dependence: Secondary | ICD-10-CM | POA: Diagnosis not present

## 2020-03-14 DIAGNOSIS — Z20822 Contact with and (suspected) exposure to covid-19: Secondary | ICD-10-CM | POA: Diagnosis not present

## 2020-03-14 DIAGNOSIS — E063 Autoimmune thyroiditis: Secondary | ICD-10-CM | POA: Diagnosis not present

## 2020-03-14 DIAGNOSIS — Z8349 Family history of other endocrine, nutritional and metabolic diseases: Secondary | ICD-10-CM | POA: Insufficient documentation

## 2020-03-14 DIAGNOSIS — Z79890 Hormone replacement therapy (postmenopausal): Secondary | ICD-10-CM | POA: Diagnosis not present

## 2020-03-14 DIAGNOSIS — J029 Acute pharyngitis, unspecified: Secondary | ICD-10-CM | POA: Diagnosis not present

## 2020-03-14 LAB — SARS CORONAVIRUS 2 (TAT 6-24 HRS): SARS Coronavirus 2: NEGATIVE

## 2020-03-14 LAB — POCT RAPID STREP A, ED / UC: Streptococcus, Group A Screen (Direct): NEGATIVE

## 2020-03-14 MED ORDER — FLUTICASONE PROPIONATE 50 MCG/ACT NA SUSP: 1.0000 | Freq: Every day | NASAL | 2 refills | Status: DC

## 2020-03-14 MED ORDER — ACETAMINOPHEN 325 MG PO TABS: 650.0000 mg | ORAL_TABLET | Freq: Four times a day (QID) | ORAL | 0 refills | Status: DC | PRN

## 2020-03-14 NOTE — ED Triage Notes (Signed)
Pt c/o sore throat on right side of throat, dysphagia, HA and right ear painx3 days.

## 2020-03-14 NOTE — ED Provider Notes (Signed)
[facility]    CSN: [id number] Arrival date & time: 03/14/20  1048      History   Chief Complaint Chief Complaint  Patient presents with  . Sore Throat    HPI Vanessa Zavala is a 24 y.o. female.   Patient reports for sore throat, headache and ear pain.  She reports symptoms are primarily in the right side.  Present been present for the last 2 to 3 days.  She reports she don't eat and drink however is painful to swallow.  Denies fever or chills.  No cough, runny nose.  No change in hearing.  No ear discharge.  Denies nausea, vomiting or diarrhea.  She is actively pregnant.  No abdominal pain or vaginal bleeding.     Past Medical History:  Diagnosis Date  . Abdominal pain   . Goiter   . Hashimoto's disease   . Hypothyroidism, acquired, autoimmune   . Oligomenorrhea   . PCOS (polycystic ovarian syndrome)   . Thyroiditis, autoimmune     Patient Active Problem List   Diagnosis Date Noted  . Normal intrauterine pregnancy on prenatal ultrasound in first trimester 01/22/2020  . Type O blood, Rh negative 01/19/2020  . Abdominal pain   . Hypothyroidism, acquired, autoimmune   . Thyroiditis, autoimmune   . Goiter   . Oligomenorrhea   . Hypothyroid 11/29/2010    Past Surgical History:  Procedure Laterality Date  . NO PAST SURGERIES      OB History    Gravida  1   Para      Term      Preterm      AB      Living        SAB      TAB      Ectopic      Multiple      Live Births               Home Medications    Prior to Admission medications   Medication Sig Start Date End Date Taking? Authorizing Provider  levothyroxine (SYNTHROID) 25 MCG tablet Take 25 mcg by mouth. 03/01/20 03/01/21 Yes [provider]  acetaminophen (TYLENOL) 325 MG tablet Take 2 tablets (650 mg total) by mouth every 6 (six) hours as needed. 03/14/20   Carrie Schoonmaker, Veryl Speak, PA-C  fluticasone (FLONASE) 50 MCG/ACT nasal spray Place 1 spray into both nostrils daily.  03/14/20   Satine Hausner, Veryl Speak, PA-C  Prenatal Vit-Fe Fumarate-FA (PRENATAL MULTIVITAMIN) TABS tablet Take 1 tablet by mouth daily at 12 noon.    [provider]    Family History Family History  Problem Relation Age of Onset  . Thyroid disease Mother   . Diabetes Maternal Grandmother   . Cancer Maternal Grandmother   . Thyroid disease Maternal Grandmother     Social History Social History   Tobacco Use  . Smoking status: Former Games developer  . Smokeless tobacco: Never Used  Vaping Use  . Vaping Use: Every day  Substance Use Topics  . Alcohol use: Not Currently    Comment: occasionally  . Drug use: Not Currently    Types: Marijuana     Allergies   Eggs or egg-derived products   Review of Systems Review of Systems   Physical Exam Triage Vital Signs ED Triage Vitals  Enc Vitals Group     BP 03/14/20 1228 116/61     Pulse Rate 03/14/20 1228 (!) 58     Resp 03/14/20 1228  18     Temp 03/14/20 1228 98 F (36.7 C)     Temp Source 03/14/20 1228 Oral     SpO2 --      Weight 03/14/20 1229 165 lb (74.8 kg)     Height 03/14/20 1229 5\' 11"  (1.803 m)     Head Circumference --      Peak Flow --      Pain Score 03/14/20 1229 8     Pain Loc --      Pain Edu? --      Excl. in GC? --    No data found.  Updated Vital Signs BP 116/61   Pulse (!) 58   Temp 98 F (36.7 C) (Oral)   Resp 18   Ht 5\' 11"  (1.803 m)   Wt 165 lb (74.8 kg)   LMP 11/25/2019 (Approximate)   BMI 23.01 kg/m   Visual Acuity Right Eye Distance:   Left Eye Distance:   Bilateral Distance:    Right Eye Near:   Left Eye Near:    Bilateral Near:     Physical Exam Vitals and nursing note reviewed.  Constitutional:      General: She is not in acute distress.    Appearance: She is well-developed. She is not ill-appearing.  HENT:     Head: Normocephalic and atraumatic.     Right Ear: Tympanic membrane normal.     Left Ear: Tympanic membrane normal.     Nose: Congestion present. No rhinorrhea.      Mouth/Throat:     Mouth: Mucous membranes are moist. No oral lesions.     Pharynx: Uvula midline. Posterior oropharyngeal erythema present. No oropharyngeal exudate or uvula swelling.     Tonsils: 0 on the right. 0 on the left.  Eyes:     Conjunctiva/sclera: Conjunctivae normal.  Cardiovascular:     Rate and Rhythm: Normal rate and regular rhythm.     Heart sounds: No murmur heard.   Pulmonary:     Effort: Pulmonary effort is normal. No respiratory distress.     Breath sounds: Normal breath sounds.  Abdominal:     Palpations: Abdomen is soft.     Tenderness: There is no abdominal tenderness.  Musculoskeletal:     Cervical back: Neck supple.  Skin:    General: Skin is warm and dry.  Neurological:     Mental Status: She is alert.      UC Treatments / Results  Labs (all labs ordered are listed, but only abnormal results are displayed) Labs Reviewed  SARS CORONAVIRUS 2 (TAT 6-24 HRS)  CULTURE, GROUP A STREP [facility])  POCT RAPID STREP A, ED / UC    EKG   Radiology No results found.  Procedures Procedures (including critical care time)  Medications Ordered in UC Medications - No data to display  Initial Impression / Assessment and Plan / UC Course  I have reviewed the triage vital signs and the nursing notes.  Pertinent labs & imaging results that were available during my care of the patient were reviewed by me and considered in my medical decision making (see chart for details).     #Viral pharyngitis Patient is a 24 year old pregnant female presenting with what appears to be viral pharyngitis.  Rapid strep negative.  Covid sent.  Culture sent.  Will hold off on any antibiotics until culture returns.  Symptomatic management with Tylenol, Flonase and alcohol free lozenges.  Discussed return, follow-up and emergency room precautions.  Patient verbalized understand  plan of care Final Clinical Impressions(s) / UC Diagnoses   Final diagnoses:  Viral pharyngitis      Discharge Instructions     Use tylenol as prescribed Use flonase as prescribed Salt water gargles Soothing liquids   You may use alcohol free sore throat lozenge or spray  We have sent a culture and COVID test, we will call if we need to chagne any  Follow-up with your primary and OB/GYN  If unable to tolerate liquids or swallow return or go to the emergency department  If your Covid-19 test is positive, you will receive a phone call from [facility] regarding your results. Negative test results are not called. Both positive and negative results area always visible on MyChart. If you do not have a MyChart account, sign up instructions are in your discharge papers.   Persons who are directed to care for themselves at home may discontinue isolation under the following conditions:  . At least 10 days have passed since symptom onset and . At least 24 hours have passed without running a fever (this means without the use of fever-reducing medications) and . Other symptoms have improved.  Persons infected with COVID-19 who never develop symptoms may discontinue isolation and other precautions 10 days after the date of their first positive COVID-19 test.       ED Prescriptions    Medication Sig Dispense Auth. Provider   acetaminophen (TYLENOL) 325 MG tablet Take 2 tablets (650 mg total) by mouth every 6 (six) hours as needed. 30 tablet Alekxander Isola, Veryl Speak, PA-C   fluticasone (FLONASE) 50 MCG/ACT nasal spray Place 1 spray into both nostrils daily. 15.8 mL Deslyn Cavenaugh, Veryl Speak, PA-C     PDMP not reviewed this encounter.   Hermelinda Medicus, PA-C 03/14/20 2252

## 2020-03-14 NOTE — Discharge Instructions (Signed)
Use tylenol as prescribed Use flonase as prescribed Salt water gargles Soothing liquids   You may use alcohol free sore throat lozenge or spray  We have sent a culture and COVID test, we will call if we need to chagne any  Follow-up with your primary and OB/GYN  If unable to tolerate liquids or swallow return or go to the emergency department  If your Covid-19 test is positive, you will receive a phone call from [facility] regarding your results. Negative test results are not called. Both positive and negative results area always visible on MyChart. If you do not have a MyChart account, sign up instructions are in your discharge papers.   Persons who are directed to care for themselves at home may discontinue isolation under the following conditions:   At least 10 days have passed since symptom onset and  At least 24 hours have passed without running a fever (this means without the use of fever-reducing medications) and  Other symptoms have improved.  Persons infected with COVID-19 who never develop symptoms may discontinue isolation and other precautions 10 days after the date of their first positive COVID-19 test.

## 2020-03-17 LAB — CULTURE, GROUP A STREP (THRC)

## 2020-03-23 DIAGNOSIS — Z23 Encounter for immunization: Secondary | ICD-10-CM | POA: Diagnosis not present

## 2020-04-21 DIAGNOSIS — Z3402 Encounter for supervision of normal first pregnancy, second trimester: Secondary | ICD-10-CM | POA: Diagnosis not present

## 2020-04-21 DIAGNOSIS — O99282 Endocrine, nutritional and metabolic diseases complicating pregnancy, second trimester: Secondary | ICD-10-CM | POA: Diagnosis not present

## 2020-05-19 DIAGNOSIS — E039 Hypothyroidism, unspecified: Secondary | ICD-10-CM | POA: Diagnosis not present

## 2020-06-09 ENCOUNTER — Ambulatory Visit
Admission: EM | Admit: 2020-06-09 | Discharge: 2020-06-09 | Discharge status: Absent without leave | Payer: Medicaid Other

## 2020-06-09 ENCOUNTER — Other Ambulatory Visit: Payer: Self-pay

## 2020-06-09 ENCOUNTER — Encounter (HOSPITAL_COMMUNITY): Payer: Self-pay | Admitting: Obstetrics & Gynecology

## 2020-06-09 ENCOUNTER — Inpatient Hospital Stay (HOSPITAL_COMMUNITY)
Admission: AD | Admit: 2020-06-09 | Discharge: 2020-06-09 | Disposition: A | Discharge status: Home / Self Care | Payer: Medicaid Other | Attending: Obstetrics & Gynecology | Admitting: Obstetrics & Gynecology

## 2020-06-09 ENCOUNTER — Inpatient Hospital Stay (HOSPITAL_COMMUNITY): Payer: Medicaid Other

## 2020-06-09 DIAGNOSIS — R0789 Other chest pain: Secondary | ICD-10-CM | POA: Insufficient documentation

## 2020-06-09 DIAGNOSIS — Z20822 Contact with and (suspected) exposure to covid-19: Secondary | ICD-10-CM | POA: Insufficient documentation

## 2020-06-09 DIAGNOSIS — R0602 Shortness of breath: Secondary | ICD-10-CM | POA: Diagnosis not present

## 2020-06-09 DIAGNOSIS — R051 Acute cough: Secondary | ICD-10-CM | POA: Insufficient documentation

## 2020-06-09 DIAGNOSIS — Z3A25 25 weeks gestation of pregnancy: Secondary | ICD-10-CM | POA: Diagnosis not present

## 2020-06-09 DIAGNOSIS — B349 Viral infection, unspecified: Secondary | ICD-10-CM | POA: Diagnosis not present

## 2020-06-09 DIAGNOSIS — O98512 Other viral diseases complicating pregnancy, second trimester: Secondary | ICD-10-CM | POA: Diagnosis not present

## 2020-06-09 DIAGNOSIS — Z79899 Other long term (current) drug therapy: Secondary | ICD-10-CM | POA: Diagnosis not present

## 2020-06-09 DIAGNOSIS — Z87891 Personal history of nicotine dependence: Secondary | ICD-10-CM | POA: Insufficient documentation

## 2020-06-09 DIAGNOSIS — Z79890 Hormone replacement therapy (postmenopausal): Secondary | ICD-10-CM | POA: Diagnosis not present

## 2020-06-09 LAB — CBC WITH DIFFERENTIAL/PLATELET
Abs Immature Granulocytes: 0.2 10*3/uL — ABNORMAL HIGH (ref 0.00–0.07)
Basophils Absolute: 0 10*3/uL (ref 0.0–0.1)
Basophils Relative: 0 %
Eosinophils Absolute: 0.1 10*3/uL (ref 0.0–0.5)
Eosinophils Relative: 1 %
HCT: 34.1 % — ABNORMAL LOW (ref 36.0–46.0)
Hemoglobin: 12 g/dL (ref 12.0–15.0)
Immature Granulocytes: 2 %
Lymphocytes Relative: 13 %
Lymphs Abs: 1.6 10*3/uL (ref 0.7–4.0)
MCH: 31.5 pg (ref 26.0–34.0)
MCHC: 35.2 g/dL (ref 30.0–36.0)
MCV: 89.5 fL (ref 80.0–100.0)
Monocytes Absolute: 0.8 10*3/uL (ref 0.1–1.0)
Monocytes Relative: 7 %
Neutro Abs: 10 10*3/uL — ABNORMAL HIGH (ref 1.7–7.7)
Neutrophils Relative %: 77 %
Platelets: 276 10*3/uL (ref 150–400)
RBC: 3.81 MIL/uL — ABNORMAL LOW (ref 3.87–5.11)
RDW: 13.5 % (ref 11.5–15.5)
WBC: 12.8 10*3/uL — ABNORMAL HIGH (ref 4.0–10.5)
nRBC: 0.2 % (ref 0.0–0.2)

## 2020-06-09 LAB — COMPREHENSIVE METABOLIC PANEL
ALT: 20 U/L (ref 0–44)
AST: 15 U/L (ref 15–41)
Albumin: 3.1 g/dL — ABNORMAL LOW (ref 3.5–5.0)
Alkaline Phosphatase: 81 U/L (ref 38–126)
Anion gap: 9 (ref 5–15)
BUN: 9 mg/dL (ref 6–20)
CO2: 25 mmol/L (ref 22–32)
Calcium: 9.4 mg/dL (ref 8.9–10.3)
Chloride: 103 mmol/L (ref 98–111)
Creatinine, Ser: 0.65 mg/dL (ref 0.44–1.00)
GFR, Estimated: >60 mL/min (ref >60)
Glucose, Bld: 93 mg/dL (ref 70–99)
Potassium: 3.6 mmol/L (ref 3.5–5.1)
Sodium: 137 mmol/L (ref 135–145)
Total Bilirubin: 0.3 mg/dL (ref 0.3–1.2)
Total Protein: 6.2 g/dL — ABNORMAL LOW (ref 6.5–8.1)

## 2020-06-09 LAB — RESP PANEL BY RT-PCR (FLU A&B, COVID) ARPGX2
Influenza A by PCR: NEGATIVE
Influenza B by PCR: NEGATIVE
SARS Coronavirus 2 by RT PCR: NEGATIVE

## 2020-06-09 LAB — URINALYSIS, ROUTINE W REFLEX MICROSCOPIC
Bilirubin Urine: NEGATIVE
Glucose, UA: NEGATIVE mg/dL
Hgb urine dipstick: NEGATIVE
Ketones, ur: NEGATIVE mg/dL
Leukocytes,Ua: NEGATIVE
Nitrite: NEGATIVE
Protein, ur: NEGATIVE mg/dL
Specific Gravity, Urine: 1.018 (ref 1.005–1.030)
pH: 6 (ref 5.0–8.0)

## 2020-06-09 MED ORDER — ALBUTEROL SULFATE HFA 108 (90 BASE) MCG/ACT IN AERS: 1.0000 | INHALATION_SPRAY | Freq: Four times a day (QID) | RESPIRATORY_TRACT | 0 refills | Status: DC | PRN

## 2020-06-09 NOTE — MAU Note (Signed)
Woke up 2 or 4 days ago with scratchy  Dry throat.  Started getting a dry cough.  This morning when she woke up, she felt SOB and dizzy.  Chest is tight.  Feels like a bad bronchitis.  Started poring out sweat last night.

## 2020-06-09 NOTE — Discharge Instructions (Signed)
Viral Respiratory Infection A respiratory infection is an illness that affects part of the respiratory system, such as the lungs, nose, or throat. A respiratory infection that is caused by a virus is called a viral respiratory infection. Common types of viral respiratory infections include:  A cold.  The flu (influenza).  A respiratory syncytial virus (RSV) infection. What are the causes? This condition is caused by a virus. What are the signs or symptoms? Symptoms of this condition include:  A stuffy or runny nose.  Yellow or green nasal discharge.  A cough.  Sneezing.  Fatigue.  Achy muscles.  A sore throat.  Sweating or chills.  A fever.  A headache. How is this diagnosed? This condition may be diagnosed based on:  Your symptoms.  A physical exam.  Testing of nasal swabs. How is this treated? This condition may be treated with medicines, such as:  Antiviral medicine. This may shorten the length of time a person has symptoms.  Expectorants. These make it easier to cough up mucus.  Decongestant nasal sprays.  Acetaminophen or NSAIDs to relieve fever and pain. Antibiotic medicines are not prescribed for viral infections. This is because antibiotics are designed to kill bacteria. They are not effective against viruses. Follow these instructions at home:  Managing pain and congestion  Take over-the-counter and prescription medicines only as told by your health care provider.  If you have a sore throat, gargle with a salt-water mixture 3-4 times a day or as needed. To make a salt-water mixture, completely dissolve -1 tsp of salt in 1 cup of warm water.  Use nose drops made from salt water to ease congestion and soften raw skin around your nose.  Drink enough fluid to keep your urine pale yellow. This helps prevent dehydration and helps loosen up mucus. General instructions  Rest as much as possible.  Do not drink alcohol.  Do not use any products  that contain nicotine or tobacco, such as cigarettes and e-cigarettes. If you need help quitting, ask your health care provider.  Keep all follow-up visits as told by your health care provider. This is important. How is this prevented?   Get an annual flu shot. You may get the flu shot in late summer, fall, or winter. Ask your health care provider when you should get your flu shot.  Avoid exposing others to your respiratory infection. ? Stay home from work or school as told by your health care provider. ? Wash your hands with soap and water often, especially after you cough or sneeze. If soap and water are not available, use alcohol-based hand sanitizer.  Avoid contact with people who are sick during cold and flu season. This is generally fall and winter. Contact a health care provider if:  Your symptoms last for 10 days or longer.  Your symptoms get worse over time.  You have a fever.  You have severe sinus pain in your face or forehead.  The glands in your jaw or neck become very swollen. Get help right away if you:  Feel pain or pressure in your chest.  Have shortness of breath.  Faint or feel like you will faint.  Have severe and persistent vomiting.  Feel confused or disoriented. Summary  A respiratory infection is an illness that affects part of the respiratory system, such as the lungs, nose, or throat. A respiratory infection that is caused by a virus is called a viral respiratory infection.  Common types of viral respiratory infections are a   cold, influenza, and respiratory syncytial virus (RSV) infection.  Symptoms of this condition include a stuffy or runny nose, cough, sneezing, fatigue, achy muscles, sore throat, and fevers or chills.  Antibiotic medicines are not prescribed for viral infections. This is because antibiotics are designed to kill bacteria. They are not effective against viruses. This information is not intended to replace advice given to you by  your health care provider. Make sure you discuss any questions you have with your health care provider. Document Revised: 07/02/2018 Document Reviewed: 08/04/2017 Elsevier Patient Education  2020 [employer].                   Safe Medications in Pregnancy    Acne: Benzoyl Peroxide Salicylic Acid  Backache/Headache: Tylenol: 2 regular strength every 4 hours OR              2 Extra strength every 6 hours  Colds/Coughs/Allergies: Benadryl (alcohol free) 25 mg every 6 hours as needed Breath right strips Claritin Cepacol throat lozenges Chloraseptic throat spray Cold-Eeze- up to three times per day Cough drops, alcohol free Flonase (by prescription only) Guaifenesin Mucinex Robitussin DM (plain only, alcohol free) Saline nasal spray/drops Sudafed (pseudoephedrine) & Actifed ** use only after [redacted] weeks gestation and if you do not have high blood pressure Tylenol Vicks Vaporub Zinc lozenges Zyrtec   Constipation: Colace Ducolax suppositories Fleet enema Glycerin suppositories Metamucil Milk of magnesia Miralax Senokot Smooth move tea  Diarrhea: Kaopectate Imodium A-D  *NO pepto Bismol  Hemorrhoids: Anusol Anusol HC Preparation H Tucks  Indigestion: Tums Maalox Mylanta Zantac  Pepcid  Insomnia: Benadryl (alcohol free) 25mg  every 6 hours as needed Tylenol PM Unisom, no Gelcaps  Leg Cramps: Tums MagGel  Nausea/Vomiting:  Bonine Dramamine Emetrol Ginger extract Sea bands Meclizine  Nausea medication to take during pregnancy:  Unisom (doxylamine succinate 25 mg tablets) Take one tablet daily at bedtime. If symptoms are not adequately controlled, the dose can be increased to a maximum recommended dose of two tablets daily (1/2 tablet in the morning, 1/2 tablet mid-afternoon and one at bedtime). Vitamin B6 100mg  tablets. Take one tablet twice a day (up to 200 mg per day).  Skin Rashes: Aveeno products Benadryl cream or 25mg  every 6 hours as  needed Calamine Lotion 1% cortisone cream  Yeast infection: Gyne-lotrimin 7 Monistat 7   **If taking multiple medications, please check labels to avoid duplicating the same active ingredients **take medication as directed on the label ** Do not exceed 4000 mg of tylenol in 24 hours **Do not take medications that contain aspirin or ibuprofen

## 2020-06-09 NOTE — MAU Provider Note (Signed)
History    CSN: [id number]  Arrival date and time: 06/09/20 1324  First Provider Initiated Contact with Patient 06/09/20 1426    Chief Complaint  Patient presents with  . Cough  . Shortness of Breath  . Dizziness  . Sore Throat   Vanessa Zavala is a 24 y.o. G1P0 at [redacted]w[redacted]d who presents to MAU with reports of chest pain and cough. Pt reports 2-3 days ago she started having a dry scratchy throat. She reports that it felt as if she inhaled a bunch of dust. She developed a dry cough soon after and is now endorsing chest tightness for the past day. She feels as if someone is standing on her chest. Pressure is similar to the time she had bronchitis or pneumonia. She denies fever, congestion, has SOB only when lying down. Her throat is no longer an issue. She denies any recent sick contacts. No cardiac history. She did not receive her Covid vaccine, but received her Flu vaccine about 4 weeks ago. She denies contractions, vaginal bleeding, or leaking fluid. She endorses good fetal movement.    OB History    Gravida  1   Para      Term      Preterm      AB      Living        SAB      TAB      Ectopic      Multiple      Live Births              Past Medical History:  Diagnosis Date  . Abdominal pain   . Goiter   . Hashimoto's disease   . Hypothyroidism, acquired, autoimmune   . Oligomenorrhea   . PCOS (polycystic ovarian syndrome)   . Thyroiditis, autoimmune     Past Surgical History:  Procedure Laterality Date  . NO PAST SURGERIES      Family History  Problem Relation Age of Onset  . Thyroid disease Mother   . Diabetes Maternal Grandmother   . Cancer Maternal Grandmother   . Thyroid disease Maternal Grandmother     Social History   Tobacco Use  . Smoking status: Former Games developer  . Smokeless tobacco: Never Used  Vaping Use  . Vaping Use: Every day  Substance Use Topics  . Alcohol use: Not Currently    Comment: occasionally  . Drug use: Not Currently     Types: Marijuana    Allergies:  Allergies  Allergen Reactions  . Eggs Or Egg-Derived Products     Medications Prior to Admission  Medication Sig Dispense Refill Last Dose  . levothyroxine (SYNTHROID) 25 MCG tablet Take 25 mcg by mouth.   06/09/2020 at Unknown time  . Prenatal Vit-Fe Fumarate-FA (PRENATAL MULTIVITAMIN) TABS tablet Take 1 tablet by mouth daily at 12 noon.   06/09/2020 at Unknown time  . acetaminophen (TYLENOL) 325 MG tablet Take 2 tablets (650 mg total) by mouth every 6 (six) hours as needed. 30 tablet 0   . fluticasone (FLONASE) 50 MCG/ACT nasal spray Place 1 spray into both nostrils daily. 15.8 mL 2    Review of Systems  Constitutional: Positive for diaphoresis. Negative for fever.  HENT: Negative for congestion, rhinorrhea and sore throat.   Eyes: Negative.   Respiratory: Positive for chest tightness and shortness of breath (when lying).   Cardiovascular: Negative.   Gastrointestinal: Negative.   Genitourinary: Negative.   Skin: Negative.   Neurological: Negative.   Psychiatric/Behavioral:  Negative.    Physical Exam   Blood pressure (!) 127/59, pulse 79, temperature 98 F (36.7 C), temperature source Oral, resp. rate 20, height 5\' 11"  (1.803 m), weight 88.4 kg, last menstrual period 11/25/2019, SpO2 100 %.  Physical Exam Constitutional:      Appearance: She is well-developed.  Cardiovascular:     Rate and Rhythm: Normal rate and regular rhythm.  Pulmonary:     Effort: Pulmonary effort is normal.     Breath sounds: Normal breath sounds.  Abdominal:     Palpations: Abdomen is soft.  Musculoskeletal:        General: Normal range of motion.  Skin:    General: Skin is warm and dry.  Neurological:     General: No focal deficit present.     Mental Status: She is alert.    FHR: 140, moderate variability, + 10x10 accelerations, no decelerations Toco: no contractions  MAU Course  Procedures  MDM UA CBC CMP Covid swab EKG- reviewed by Dr.  11/27/2019- cardiology- normal Chest x-ray  Assessment and Plan  1. Viral Syndrome - Covid swab negative - EKG reviewed by cardiology and normal - Chest x-ray negative - Symptoms likely related to viral syndrome - Reviewed medications safe in pregnancy; albuterol rx sent to pharmacy - Warning signs and when to seek emergency care reviewed   2. [redacted] weeks gestation of pregnancy - Keep OB appointment as scheduled on 06/14/20    14/8/21, CNM 06/09/2020, 4:31 PM

## 2020-06-21 DIAGNOSIS — Z3482 Encounter for supervision of other normal pregnancy, second trimester: Secondary | ICD-10-CM | POA: Diagnosis not present

## 2020-06-22 DIAGNOSIS — R8290 Unspecified abnormal findings in urine: Secondary | ICD-10-CM | POA: Diagnosis not present

## 2020-07-08 NOTE — L&D Delivery Note (Signed)
Delivery Note  Vanessa Zavala is a G1P0 at [redacted]w[redacted]d with an LMP of 11/25/2019, inconsistent with [place] at 6 weeks.   First Stage: Labor onset: 1000 09/19/2020 Augmentation: None  Analgesia /Anesthesia intrapartum: Epidural SROM at 1545  Second Stage: Complete dilation at 2029 Onset of pushing at 2114 FHR second stage 120 bpm with moderate variability and intermittent variables   [name] presented to L&D with regular, painful contractions that became worse that morning.  She SROM for clear fluid after receiving an epidural.  She progressed spontaneously to C/C/+1 but did not have an urge to push.  She labored down for approx 45 minutes until she had a strong urge to push. [name] pushed well over approximately 2 hours while utilizing a variety of pushing positions to help with rotation and descent.   Delivery of a viable baby boy on 09/18/2020 at 2306 by CNM Delivery of fetal head in OA position with restitution to ROT. No nuchal cord;  Anterior then posterior shoulders delivered easily with gentle downward traction. Compound left hand reduced. Baby placed on mom's chest, and attended to by baby RN Cord double clamped after cessation of pulsation, cut by Sharman's mother.  Cord blood sample collected: Rh Neg  Third Stage: Oxytocin bolus started after delivery of infant for hemorrhage prophylaxis  Placenta delivered intact with 3 VC @ 2306 Placenta disposition: to pathology Uterine tone firm / bleeding moderate  Bilateral labial laceration identified  Anesthesia for repair: epidural Repair with 3-0 vicryl SH x 2.  Figure 8 knot used on right labia for hemostasis of blood vessel.  Laceration then approximated with continuous suture.  Repair of left labial laceration was completed with 2 interrupted sutures.  Est. Blood Loss (mL): Urine: with straight cath after repair was completed   Complications: None  Mom to postpartum.  Baby to Couplet care / Skin to  Skin.  Newborn: Information for the patient's newborn:  Dannika, Hilgeman [[id number]]  Live born female  Birth Weight:  8lbs 7oz APGAR: 8, 9   Newborn Delivery   Birth date/time: 09/18/2020 23:06:00 Delivery type: Vaginal, Spontaneous     Feeding planned: formula   ---------- Margaretmary Eddy, CNM Certified Nurse Midwife [facility]

## 2020-08-10 DIAGNOSIS — Z6791 Unspecified blood type, Rh negative: Secondary | ICD-10-CM | POA: Diagnosis not present

## 2020-08-10 DIAGNOSIS — E039 Hypothyroidism, unspecified: Secondary | ICD-10-CM | POA: Diagnosis not present

## 2020-08-10 DIAGNOSIS — O26893 Other specified pregnancy related conditions, third trimester: Secondary | ICD-10-CM | POA: Diagnosis not present

## 2020-08-10 DIAGNOSIS — O2340 Unspecified infection of urinary tract in pregnancy, unspecified trimester: Secondary | ICD-10-CM | POA: Diagnosis not present

## 2020-08-23 DIAGNOSIS — O26843 Uterine size-date discrepancy, third trimester: Secondary | ICD-10-CM | POA: Diagnosis not present

## 2020-08-30 DIAGNOSIS — Z114 Encounter for screening for human immunodeficiency virus [HIV]: Secondary | ICD-10-CM | POA: Diagnosis not present

## 2020-08-30 DIAGNOSIS — Z3403 Encounter for supervision of normal first pregnancy, third trimester: Secondary | ICD-10-CM | POA: Diagnosis not present

## 2020-08-30 DIAGNOSIS — Z113 Encounter for screening for infections with a predominantly sexual mode of transmission: Secondary | ICD-10-CM | POA: Diagnosis not present

## 2020-08-30 LAB — OB RESULTS CONSOLE GBS: GBS: NEGATIVE

## 2020-08-30 LAB — OB RESULTS CONSOLE GC/CHLAMYDIA
Chlamydia: NEGATIVE
Gonorrhea: NEGATIVE

## 2020-09-18 ENCOUNTER — Encounter: Payer: Self-pay | Admitting: Obstetrics and Gynecology

## 2020-09-18 ENCOUNTER — Inpatient Hospital Stay: Payer: Medicaid Other | Admitting: Registered Nurse

## 2020-09-18 ENCOUNTER — Inpatient Hospital Stay
Admission: EM | Admit: 2020-09-18 | Discharge: 2020-09-20 | DRG: 807 | Disposition: A | Discharge status: Home / Self Care | Payer: Medicaid Other

## 2020-09-18 ENCOUNTER — Other Ambulatory Visit: Payer: Self-pay

## 2020-09-18 DIAGNOSIS — Z8616 Personal history of COVID-19: Secondary | ICD-10-CM

## 2020-09-18 DIAGNOSIS — O99284 Endocrine, nutritional and metabolic diseases complicating childbirth: Secondary | ICD-10-CM | POA: Diagnosis not present

## 2020-09-18 DIAGNOSIS — Z3490 Encounter for supervision of normal pregnancy, unspecified, unspecified trimester: Secondary | ICD-10-CM

## 2020-09-18 DIAGNOSIS — Z6791 Unspecified blood type, Rh negative: Secondary | ICD-10-CM | POA: Diagnosis not present

## 2020-09-18 DIAGNOSIS — O26893 Other specified pregnancy related conditions, third trimester: Secondary | ICD-10-CM | POA: Diagnosis not present

## 2020-09-18 DIAGNOSIS — Z87891 Personal history of nicotine dependence: Secondary | ICD-10-CM | POA: Diagnosis not present

## 2020-09-18 DIAGNOSIS — Z3A39 39 weeks gestation of pregnancy: Secondary | ICD-10-CM

## 2020-09-18 DIAGNOSIS — E039 Hypothyroidism, unspecified: Secondary | ICD-10-CM | POA: Diagnosis not present

## 2020-09-18 DIAGNOSIS — Z20822 Contact with and (suspected) exposure to covid-19: Secondary | ICD-10-CM | POA: Diagnosis not present

## 2020-09-18 DIAGNOSIS — Z6741 Type O blood, Rh negative: Secondary | ICD-10-CM

## 2020-09-18 DIAGNOSIS — E063 Autoimmune thyroiditis: Secondary | ICD-10-CM | POA: Diagnosis present

## 2020-09-18 DIAGNOSIS — O26843 Uterine size-date discrepancy, third trimester: Secondary | ICD-10-CM | POA: Diagnosis present

## 2020-09-18 LAB — TYPE AND SCREEN
ABO/RH(D): O NEG
Antibody Screen: POSITIVE

## 2020-09-18 LAB — COMPREHENSIVE METABOLIC PANEL
ALT: 16 U/L (ref 0–44)
AST: 20 U/L (ref 15–41)
Albumin: 3.1 g/dL — ABNORMAL LOW (ref 3.5–5.0)
Alkaline Phosphatase: 119 U/L (ref 38–126)
Anion gap: 7 (ref 5–15)
BUN: 9 mg/dL (ref 6–20)
CO2: 21 mmol/L — ABNORMAL LOW (ref 22–32)
Calcium: 9.6 mg/dL (ref 8.9–10.3)
Chloride: 110 mmol/L (ref 98–111)
Creatinine, Ser: 0.63 mg/dL (ref 0.44–1.00)
GFR, Estimated: >60 mL/min (ref >60)
Glucose, Bld: 99 mg/dL (ref 70–99)
Potassium: 3.7 mmol/L (ref 3.5–5.1)
Sodium: 138 mmol/L (ref 135–145)
Total Bilirubin: 0.6 mg/dL (ref 0.3–1.2)
Total Protein: 6.6 g/dL (ref 6.5–8.1)

## 2020-09-18 LAB — RESP PANEL BY RT-PCR (FLU A&B, COVID) ARPGX2
Influenza A by PCR: NEGATIVE
Influenza B by PCR: NEGATIVE
SARS Coronavirus 2 by RT PCR: NEGATIVE

## 2020-09-18 LAB — CBC
HCT: 36.3 % (ref 36.0–46.0)
Hemoglobin: 12.6 g/dL (ref 12.0–15.0)
MCH: 31.1 pg (ref 26.0–34.0)
MCHC: 34.7 g/dL (ref 30.0–36.0)
MCV: 89.6 fL (ref 80.0–100.0)
Platelets: 233 10*3/uL (ref 150–400)
RBC: 4.05 MIL/uL (ref 3.87–5.11)
RDW: 14 % (ref 11.5–15.5)
WBC: 17.2 10*3/uL — ABNORMAL HIGH (ref 4.0–10.5)
nRBC: 0.2 % (ref 0.0–0.2)

## 2020-09-18 MED ORDER — SOD CITRATE-CITRIC ACID 500-334 MG/5ML PO SOLN: 30.0000 mL | ORAL | Status: DC | PRN

## 2020-09-18 MED ORDER — FENTANYL 2.5 MCG/ML W/ROPIVACAINE 0.15% IN NS 100 ML EPIDURAL (ARMC)
EPIDURAL | Status: AC
  Filled 2020-09-18: qty 100

## 2020-09-18 MED ORDER — LIDOCAINE HCL (PF) 1 % IJ SOLN
INTRAMUSCULAR | Status: DC | PRN
  Administered 2020-09-18: 3 mL via INTRADERMAL

## 2020-09-18 MED ORDER — LACTATED RINGERS IV SOLN: INTRAVENOUS | Status: DC

## 2020-09-18 MED ORDER — FENTANYL CITRATE (PF) 100 MCG/2ML IJ SOLN: 50.0000 ug | INTRAMUSCULAR | Status: DC | PRN

## 2020-09-18 MED ORDER — AMMONIA AROMATIC IN INHA
RESPIRATORY_TRACT | Status: AC
  Filled 2020-09-18: qty 10

## 2020-09-18 MED ORDER — ACETAMINOPHEN 325 MG PO TABS: 650.0000 mg | ORAL_TABLET | ORAL | Status: DC | PRN

## 2020-09-18 MED ORDER — ONDANSETRON HCL 4 MG/2ML IJ SOLN
4.0000 mg | Freq: Four times a day (QID) | INTRAMUSCULAR | Status: DC | PRN
  Administered 2020-09-18: 4 mg via INTRAVENOUS
  Filled 2020-09-18: qty 2

## 2020-09-18 MED ORDER — OXYTOCIN-SODIUM CHLORIDE 30-0.9 UT/500ML-% IV SOLN: 2.5000 [IU]/h | INTRAVENOUS | Status: DC

## 2020-09-18 MED ORDER — OXYTOCIN BOLUS FROM INFUSION
333.0000 mL | Freq: Once | INTRAVENOUS | Status: AC
  Administered 2020-09-18: 333 mL via INTRAVENOUS

## 2020-09-18 MED ORDER — OXYTOCIN-SODIUM CHLORIDE 30-0.9 UT/500ML-% IV SOLN
INTRAVENOUS | Status: AC
  Filled 2020-09-18: qty 500

## 2020-09-18 MED ORDER — LIDOCAINE HCL (PF) 1 % IJ SOLN
INTRAMUSCULAR | Status: AC
  Filled 2020-09-18: qty 30

## 2020-09-18 MED ORDER — LACTATED RINGERS IV SOLN
500.0000 mL | INTRAVENOUS | Status: DC | PRN
  Administered 2020-09-18: 500 mL via INTRAVENOUS

## 2020-09-18 MED ORDER — LIDOCAINE-EPINEPHRINE (PF) 1.5 %-1:200000 IJ SOLN
INTRAMUSCULAR | Status: DC | PRN
  Administered 2020-09-18: 3 mL via EPIDURAL

## 2020-09-18 MED ORDER — BUPIVACAINE HCL (PF) 0.25 % IJ SOLN
INTRAMUSCULAR | Status: DC | PRN
  Administered 2020-09-18 (×2): 4 mL via EPIDURAL

## 2020-09-18 MED ORDER — MISOPROSTOL 200 MCG PO TABS
ORAL_TABLET | ORAL | Status: AC
  Filled 2020-09-18: qty 4

## 2020-09-18 MED ORDER — LEVOTHYROXINE SODIUM 75 MCG PO TABS
75.0000 ug | ORAL_TABLET | Freq: Every day | ORAL | Status: DC
  Administered 2020-09-19 – 2020-09-20 (×2): 75 ug via ORAL
  Filled 2020-09-18 (×3): qty 1

## 2020-09-18 MED ORDER — FENTANYL 2.5 MCG/ML W/ROPIVACAINE 0.15% IN NS 100 ML EPIDURAL (ARMC)
EPIDURAL | Status: DC | PRN
  Administered 2020-09-18: 12 mL/h via EPIDURAL

## 2020-09-18 MED ORDER — OXYTOCIN 10 UNIT/ML IJ SOLN
INTRAMUSCULAR | Status: AC
  Filled 2020-09-18: qty 2

## 2020-09-18 MED ORDER — LIDOCAINE HCL (PF) 1 % IJ SOLN
30.0000 mL | INTRAMUSCULAR | Status: AC | PRN
  Administered 2020-09-18: 30 mL via SUBCUTANEOUS

## 2020-09-18 NOTE — Progress Notes (Signed)
Labor Progress Note  Vanessa Zavala is a 25 y.o. G1P0 at [redacted]w[redacted]d by ultrasound admitted for active labor  Subjective: feeling intermittent pressure   Objective: BP (!) 142/78   Pulse 64   Temp 98.5 F (36.9 C) (Oral)   Resp 18   Ht 5\' 11"  (1.803 m)   Wt 102.1 kg   LMP 11/25/2019 (Approximate)   SpO2 99%   BMI 31.38 kg/m  Notable VS details: reviewed   Fetal Assessment: FHT:  FHR: 120 bpm, variability: moderate,  accelerations:  Present,  decelerations:  Absent Category/reactivity:  Category I UC:   regular, every 2-3 minutes SVE:   10/100/+1 Membrane status: SROM at 1545  Amniotic color: Clear  Labs: Lab Results  Component Value Date   WBC 17.2 (H) 09/18/2020   HGB 12.6 09/18/2020   HCT 36.3 09/18/2020   MCV 89.6 09/18/2020   PLT 233 09/18/2020    Assessment / Plan: Spontaneous labor, progressing normally -Feeling intermittent pressure but not feeling a strong urge to push -Will labor down and use frequent repositioning to help with fetal rotation  Labor: Progressing normally Preeclampsia:  no signs or symptoms of toxicity Fetal Wellbeing:  Category I Pain Control:  Epidural I/D:  n/a Anticipated MOD:  NSVD  09/20/2020, CNM 09/18/2020, 8:41 PM

## 2020-09-18 NOTE — Progress Notes (Signed)
Labor Progress Note  Vanessa Zavala is a 25 y.o. G1P0 at [redacted]w[redacted]d by ultrasound admitted for active labor  Subjective: Feeling more comfortable with epidural  Objective: BP 136/69 (BP Location: Left Arm)   Pulse 88   Temp 98.4 F (36.9 C) (Oral)   Resp 18   Ht 5\' 11"  (1.803 m)   Wt 102.1 kg   LMP 11/25/2019 (Approximate)   SpO2 97%   BMI 31.38 kg/m  Notable VS details: reviewed   Fetal Assessment: FHT:  FHR: 125 bpm, variability: moderate,  accelerations:  Present,  decelerations:  Absent Category/reactivity:  Category I UC:   regular, every 3-5 minutes SVE:   7/100/-1 Membrane status: SROM at 1545, Forebag ruptured  Amniotic color: Clear  Labs: Lab Results  Component Value Date   WBC 17.2 (H) 09/18/2020   HGB 12.6 09/18/2020   HCT 36.3 09/18/2020   MCV 89.6 09/18/2020   PLT 233 09/18/2020    Assessment / Plan: Spontaneous labor, progressing normally  Labor: Progressing normally Preeclampsia:  no signs or symptoms of toxicity Fetal Wellbeing:  Category I Pain Control:  Epidural I/D:  n/a Anticipated MOD:  NSVD  09/20/2020, CNM 09/18/2020, 6:03 PM

## 2020-09-18 NOTE — H&P (Signed)
OB History & Physical   History of Present Illness:  Chief Complaint: contractions   HPI:  Vanessa Zavala is a 25 y.o. G1P0 female at [redacted]w[redacted]d dated by 6wk [place].  She presents to L&D for onset of ctx after membrane sweep on Friday, currently every 4-6 minutes, UCs worsened this am when she woke up. Mostly back pain with UCs. Some pink tinged mucus noted intermittently, no leaking fluid. Reports active FM.      Pregnancy Issues: 1. H/o mental health diagnoses: Bipolar disorder, depression, anxiety   Medications prior to pregnancy: She stopped taking her Adderall at the beginning the pregnancy (had been taking since 25yo). Previously has tried Zoloft, and Jordan but didn't like how they worked  Fiance died suddenly on June 29, 2020- cardiac arrest suddenly in front of Raniya and his 51 year old daughter 2. Hypothyroidism- current dose of synthroid daily 3. Rh negative, given rhogam 08/10/20 4. S<D, growth [place] 08/23/20- EFW 45%, 2818g 5. Rubella Non-immune   Maternal Medical History:   Past Medical History:  Diagnosis Date  . Abdominal pain   . Goiter   . Hashimoto's disease   . Hypothyroidism, acquired, autoimmune   . Oligomenorrhea   . PCOS (polycystic ovarian syndrome)   . Thyroiditis, autoimmune     Past Surgical History:  Procedure Laterality Date  . NO PAST SURGERIES      Allergies  Allergen Reactions  . Eggs Or Egg-Derived Products     Prior to Admission medications   Medication Sig Start Date End Date Taking? Authorizing Provider  levothyroxine (SYNTHROID) 25 MCG tablet Take 25 mcg by mouth. 03/01/20 03/01/21 Yes [provider]  Prenatal Vit-Fe Fumarate-FA (PRENATAL MULTIVITAMIN) TABS tablet Take 1 tablet by mouth daily at 12 noon.   Yes [provider]  acetaminophen (TYLENOL) 325 MG tablet Take 2 tablets (650 mg total) by mouth every 6 (six) hours as needed. Patient not taking: Reported on 09/18/2020 03/14/20   Darr, Gerilyn Pilgrim, PA-C  albuterol (VENTOLIN  HFA) 108 (90 Base) MCG/ACT inhaler Inhale 1-2 puffs into the lungs every 6 (six) hours as needed for wheezing or shortness of breath. 06/09/20   Brand Males, CNM  fluticasone (FLONASE) 50 MCG/ACT nasal spray Place 1 spray into both nostrils daily. 03/14/20   Darr, Gerilyn Pilgrim, PA-C     Prenatal care site: [facility]  Social History: She  reports that she has quit smoking. She has never used smokeless tobacco. She reports previous alcohol use. She reports previous drug use. Drug: Marijuana.  Family History: family history includes Cancer in her maternal grandmother; Diabetes in her maternal grandmother; Thyroid disease in her maternal grandmother and mother.   Review of Systems: A full review of systems was performed and negative except as noted in the HPI.     Physical Exam:  Vital Signs: BP 138/89 (BP Location: Left Arm)   Pulse 83   Temp 98.7 F (37.1 C) (Oral)   Resp 16   Ht 5\' 11"  (1.803 m)   Wt 102.1 kg   LMP 11/25/2019 (Approximate)   BMI 31.38 kg/m  General: no acute distress.  HEENT: normocephalic, atraumatic Heart: regular rate & rhythm.  No murmurs/rubs/gallops Lungs: clear to auscultation bilaterally, normal respiratory effort Abdomen: soft, gravid, non-tender;  EFW: 8lbs Pelvic:   External: Normal external female genitalia  Cervix: Dilation: 5 / Effacement (%): 100 / Station: -1    Extremities: non-tender, symmetric, no edema bilaterally.  DTRs: 2+  Neurologic: Alert & oriented x 3.  No results found for this or any previous visit (from the past 24 hour(s)).  Pertinent Results:  Prenatal Labs: Blood type/Rh O NEG, rhogam given 08/10/20  Antibody screen neg  Rubella  NON-Immune  Varicella Immune  RPR NR  HBsAg Neg  HIV NR  GC neg  Chlamydia neg  Genetic screening Negative MaterniT21  1 hour GTT  117  3 hour GTT   GBS  neg   FHT: 140bpm, mod variability, + accels, no decels TOCO: 4-41min SVE:  Dilation: 5 / Effacement (%): 100 / Station: -1     Cephalic by leopolds/SVE  No results found.  Assessment:  [name] is a 24 y.o. G1P0 female at [redacted]w[redacted]d with active labor.   Plan:  1. Admit to Labor & Delivery; consents reviewed and obtained - COVID Jan 2022, asymptomatic, no indication for repeat swab.  2. Fetal Well being  - Fetal Tracing: Cat I - Group B Streptococcus ppx indicated: Neg - Presentation: cephalic confirmed by exam/leopolds   3. Routine OB: - Prenatal labs reviewed, as above - Rh O neg - CBC, T&S, RPR on admit - Clear fluids, IVF  4. Monitoring of Labor -  Contractions: external toco in place -  Pelvis unproven, adequate for TOL -  Plan for augmentation with AROM or Pitocin if needed.  -  Plan for continuous fetal monitoring  -  Maternal pain control as desired; requesting regional anesthesia - Anticipate vaginal delivery  5. Post Partum Planning: - Infant feeding: breast - Contraception: TBD -Tdap: declined  - Flu: 03/2020  Randa Ngo, CNM 09/18/20 2:17 PM

## 2020-09-18 NOTE — Anesthesia Procedure Notes (Addendum)
Epidural Patient location during procedure: OB Start time: 09/18/2020 3:14 PM End time: 09/18/2020 3:16 PM  Staffing Resident/CRNA: Karoline Caldwell, CRNA Performed: resident/CRNA   Preanesthetic Checklist Completed: patient identified, IV checked, site marked, risks and benefits discussed, surgical consent, monitors and equipment checked, pre-op evaluation and timeout performed  Epidural Patient position: sitting Prep: Betadine Patient monitoring: heart rate, continuous pulse ox and blood pressure Approach: midline Location: L4-L5 Injection technique: LOR saline  Needle:  Needle type: Tuohy  Needle gauge: 18 G Needle length: 9 cm and 9 Needle insertion depth: 5 cm Catheter type: closed end flexible Catheter size: 20 Guage Catheter at skin depth: 10 cm Test dose: negative and 1.5% lidocaine with Epi 1:200 K  Assessment Events: blood not aspirated, injection not painful, no injection resistance, no paresthesia and negative IV test  Additional Notes   Patient tolerated the insertion well without complications.Reason for block:procedure for pain

## 2020-09-18 NOTE — Anesthesia Preprocedure Evaluation (Signed)
Anesthesia Evaluation  Patient identified by MRN, date of birth, ID band Patient awake    Reviewed: Allergy & Precautions, H&P , NPO status , Patient's Chart, lab work & pertinent test results  History of Anesthesia Complications Negative for: history of anesthetic complications  Airway Mallampati: II       Dental   Pulmonary former smoker,    Pulmonary exam normal        Cardiovascular (-) hypertensionNormal cardiovascular exam     Neuro/Psych negative neurological ROS  negative psych ROS   GI/Hepatic Neg liver ROS, GERD  ,  Endo/Other  Hypothyroidism   Renal/GU negative Renal ROS  negative genitourinary   Musculoskeletal   Abdominal   Peds  Hematology negative hematology ROS (+)   Anesthesia Other Findings   Reproductive/Obstetrics (+) Pregnancy                             Anesthesia Physical Anesthesia Plan  ASA: II  Anesthesia Plan: Epidural   Post-op Pain Management:    Induction:   PONV Risk Score and Plan:   Airway Management Planned:   Additional Equipment:   Intra-op Plan:   Post-operative Plan:   Informed Consent: I have reviewed the patients History and Physical, chart, labs and discussed the procedure including the risks, benefits and alternatives for the proposed anesthesia with the patient or authorized representative who has indicated his/her understanding and acceptance.       Plan Discussed with:   Anesthesia Plan Comments:         Anesthesia Quick Evaluation

## 2020-09-18 NOTE — OB Triage Note (Signed)
Patient G1P0 [redacted]w[redacted]d presents to L&D with complaints of contractions since Friday. She reports having her membranes swept Friday at her appointment and states she has hurt in her back since then. She reports them getting closer together and more intense. Patient reports no vaginal bleeding or leaking of fluid. Patient reports positive fetal movement. VSS. Monitors applied and assessing.

## 2020-09-19 ENCOUNTER — Encounter: Payer: Self-pay | Admitting: Obstetrics and Gynecology

## 2020-09-19 DIAGNOSIS — O26843 Uterine size-date discrepancy, third trimester: Secondary | ICD-10-CM | POA: Diagnosis present

## 2020-09-19 DIAGNOSIS — Z3A39 39 weeks gestation of pregnancy: Secondary | ICD-10-CM | POA: Diagnosis not present

## 2020-09-19 DIAGNOSIS — O326XX0 Maternal care for compound presentation, not applicable or unspecified: Secondary | ICD-10-CM | POA: Diagnosis not present

## 2020-09-19 DIAGNOSIS — D72829 Elevated white blood cell count, unspecified: Secondary | ICD-10-CM | POA: Diagnosis not present

## 2020-09-19 DIAGNOSIS — O9913 Other diseases of the blood and blood-forming organs and certain disorders involving the immune mechanism complicating the puerperium: Secondary | ICD-10-CM | POA: Diagnosis not present

## 2020-09-19 LAB — CBC
HCT: 31.5 % — ABNORMAL LOW (ref 36.0–46.0)
Hemoglobin: 11.1 g/dL — ABNORMAL LOW (ref 12.0–15.0)
MCH: 31.6 pg (ref 26.0–34.0)
MCHC: 35.2 g/dL (ref 30.0–36.0)
MCV: 89.7 fL (ref 80.0–100.0)
Platelets: 215 10*3/uL (ref 150–400)
RBC: 3.51 MIL/uL — ABNORMAL LOW (ref 3.87–5.11)
RDW: 14.3 % (ref 11.5–15.5)
WBC: 21.9 10*3/uL — ABNORMAL HIGH (ref 4.0–10.5)
nRBC: 0 % (ref 0.0–0.2)

## 2020-09-19 LAB — RPR: RPR Ser Ql: NONREACTIVE

## 2020-09-19 MED ORDER — COCONUT OIL OIL: 1.0000 "application " | TOPICAL_OIL | Status: DC | PRN

## 2020-09-19 MED ORDER — WITCH HAZEL-GLYCERIN EX PADS
1.0000 "application " | MEDICATED_PAD | CUTANEOUS | Status: DC
  Filled 2020-09-19: qty 100

## 2020-09-19 MED ORDER — DOCUSATE SODIUM 100 MG PO CAPS
100.0000 mg | ORAL_CAPSULE | Freq: Two times a day (BID) | ORAL | Status: DC
  Filled 2020-09-19: qty 1

## 2020-09-19 MED ORDER — MEASLES, MUMPS & RUBELLA VAC IJ SOLR
0.5000 mL | Freq: Once | INTRAMUSCULAR | Status: DC
  Filled 2020-09-19: qty 0.5

## 2020-09-19 MED ORDER — ACETAMINOPHEN 500 MG PO TABS
1000.0000 mg | ORAL_TABLET | Freq: Four times a day (QID) | ORAL | Status: DC | PRN
  Administered 2020-09-19: 1000 mg via ORAL

## 2020-09-19 MED ORDER — ONDANSETRON HCL 4 MG/2ML IJ SOLN: 4.0000 mg | INTRAMUSCULAR | Status: DC | PRN

## 2020-09-19 MED ORDER — PRENATAL MULTIVITAMIN CH
1.0000 | ORAL_TABLET | Freq: Every day | ORAL | Status: DC
  Administered 2020-09-19: 1 via ORAL
  Filled 2020-09-19: qty 1

## 2020-09-19 MED ORDER — ACETAMINOPHEN 500 MG PO TABS
ORAL_TABLET | ORAL | Status: AC
  Filled 2020-09-19: qty 2

## 2020-09-19 MED ORDER — ONDANSETRON HCL 4 MG PO TABS: 4.0000 mg | ORAL_TABLET | ORAL | Status: DC | PRN

## 2020-09-19 MED ORDER — IBUPROFEN 600 MG PO TABS
ORAL_TABLET | ORAL | Status: AC
  Filled 2020-09-19: qty 1

## 2020-09-19 MED ORDER — SIMETHICONE 80 MG PO CHEW: 80.0000 mg | CHEWABLE_TABLET | ORAL | Status: DC | PRN

## 2020-09-19 MED ORDER — DIBUCAINE (PERIANAL) 1 % EX OINT: 1.0000 "application " | TOPICAL_OINTMENT | CUTANEOUS | Status: DC | PRN

## 2020-09-19 MED ORDER — IBUPROFEN 600 MG PO TABS
600.0000 mg | ORAL_TABLET | Freq: Four times a day (QID) | ORAL | Status: DC
  Administered 2020-09-19 – 2020-09-20 (×6): 600 mg via ORAL
  Filled 2020-09-19 (×6): qty 1

## 2020-09-19 MED ORDER — BENZOCAINE-MENTHOL 20-0.5 % EX AERO
INHALATION_SPRAY | CUTANEOUS | Status: AC
  Filled 2020-09-19: qty 56

## 2020-09-19 MED ORDER — BENZOCAINE-MENTHOL 20-0.5 % EX AERO: 1.0000 "application " | INHALATION_SPRAY | CUTANEOUS | Status: DC | PRN

## 2020-09-19 MED ORDER — DIPHENHYDRAMINE HCL 25 MG PO CAPS: 25.0000 mg | ORAL_CAPSULE | Freq: Four times a day (QID) | ORAL | Status: DC | PRN

## 2020-09-19 NOTE — Discharge Summary (Signed)
Obstetrical Discharge Summary  Patient Name: Vanessa Zavala DOB: 13-May-1996 MRN: [id number]  Date of Admission: 09/18/2020 Date of Delivery: 09/18/2020 Delivered by: Drinda Butts, CNM  Date of Discharge: 09/20/2020  Primary OB: [facility] FIE:[id number] last menstrual period was 11/25/2019 (approximate). EDC Estimated Date of Delivery: 09/21/20 Gestational Age at Delivery: [redacted]w[redacted]d   Antepartum complications:  1. Maternal mental health diagnosis - bipolar disorder, depression, anxiety 2. Fiance died suddenly during pregnancy  3. Hypothyroidism - currently taking 52mcg of synthroid  4. Rh neg - received RhoGarm 08/10/2020 5. S<D, growth [place] 08/23/2020 - EFW 45%, 2818g 6. Rubella non-immune   Admitting Diagnosis: normal labor  Secondary Diagnosis: Patient Active Problem List   Diagnosis Date Noted  . Uterine size date discrepancy, third trimester 09/19/2020  . Labor and delivery, indication for care 09/18/2020  . Supervision of normal pregnancy 02/23/2020  . Type O blood, Rh negative 01/19/2020  . Thyroiditis, autoimmune 04/14/2019  . 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 Goiter   . Hypothyroidism 11/29/2010    Augmentation: N/A Complications: None Intrapartum complications/course:  Jullia presented to L&D with regular, painful contractions that became worse that morning.  She SROM for clear fluid after receiving an epidural.  She progressed spontaneously to C/C/+1 but did not have an urge to push.  She labored down for approx 45 minutes until she had a strong urge to push. Ronan pushed well over approximately 2 hours while utilizing a variety of pushing positions to help with rotation and descent. Delivery Type: spontaneous vaginal delivery Anesthesia: epidural Placenta: spontaneous Laceration: Bilateral labial lacerations with repair  Episiotomy: none Newborn Data: Live born female Jayce Birth Weight:  8#7 APGAR: 61, 9   Newborn Delivery   Birth  date/time: 09/18/2020 23:06:00 Delivery type: Vaginal, Spontaneous     Postpartum Procedures: none  Edinburgh:  Edinburgh Postnatal Depression Scale Screening Tool 09/19/2020  I have been able to laugh and see the funny side of things. (No Data)     Post partum course:  Patient had an uncomplicated postpartum course.  By time of discharge on PPD#2, her pain was controlled on oral pain medications; she had appropriate lochia and was ambulating, voiding without difficulty and tolerating regular diet.  She was deemed stable for discharge to home.     Discharge Physical Exam:  BP 116/70 (BP Location: Right Arm)   Pulse (!) 57   Temp 97.8 F (36.6 C)   Resp 18   Ht $R'5\' 11"'KV$  (1.803 m)   Wt 102.1 kg   LMP 11/25/2019 (Approximate)   SpO2 97%   Breastfeeding Unknown   BMI 31.38 kg/m   General: NAD CV: RRR Pulm: CTABL, nl effort ABD: s/nd/nt, fundus firm and below the umbilicus Lochia: moderate Perineum:minimal edema/repair well approximated DVT Evaluation: LE non-ttp, no evidence of DVT on exam.  Hemoglobin  Date Value Ref Range Status  09/19/2020 11.1 (L) 12.0 - 15.0 g/dL Final   HCT  Date Value Ref Range Status  09/19/2020 31.5 (L) 36.0 - 46.0 % Final     Disposition: stable, discharge to home. Baby Feeding: formula Baby Disposition: home with mom  Rh Immune globulin given: Rh Neg - RhoGam given Rubella vaccine given: Non-Immune, MMR offered prior to d/c Varivax vaccine given: Immune  Flu vaccine given in AP or PP setting: given 03/2020 Tdap vaccine given in AP or PP setting: declined   Contraception: undecided, abstinence for now   Prenatal Labs:  Blood type/Rh O NEG, rhogam given 08/10/20  Antibody  screen neg  Rubella  NON-Immune  Varicella Immune  RPR NR  HBsAg Neg  HIV NR  GC neg  Chlamydia neg  Genetic screening Negative MaterniT21  1 hour GTT  117  3 hour GTT   GBS  neg    Plan:  ABRAHAM MARGULIES was discharged to home in good condition. Follow-up  appointment for mood check in 2 weeks and with delivering provider in 6 weeks.  Discharge Medications: Allergies as of 09/20/2020   No Active Allergies     Medication List    TAKE these medications   acetaminophen 500 MG tablet Commonly known as: TYLENOL Take 2 tablets (1,000 mg total) by mouth every 6 (six) hours as needed (for pain scale < 4). What changed:   medication strength  how much to take  reasons to take this   albuterol 108 (90 Base) MCG/ACT inhaler Commonly known as: VENTOLIN HFA Inhale 1-2 puffs into the lungs every 6 (six) hours as needed for wheezing or shortness of breath.   benzocaine-Menthol 20-0.5 % Aero Commonly known as: DERMOPLAST Apply 1 application topically as needed for irritation (perineal discomfort).   coconut oil Oil Apply 1 application topically as needed.   dibucaine 1 % Oint Commonly known as: NUPERCAINAL Place 1 application rectally as needed for hemorrhoids.   docusate sodium 100 MG capsule Commonly known as: COLACE Take 1 capsule (100 mg total) by mouth 2 (two) times daily.   fluticasone 50 MCG/ACT nasal spray Commonly known as: FLONASE Place 1 spray into both nostrils daily.   ibuprofen 600 MG tablet Commonly known as: ADVIL Take 1 tablet (600 mg total) by mouth every 6 (six) hours.   levothyroxine 75 MCG tablet Commonly known as: SYNTHROID Take 1 tablet (75 mcg total) by mouth daily at 6 (six) AM.   prenatal multivitamin Tabs tablet Take 1 tablet by mouth daily at 12 noon.   simethicone 80 MG chewable tablet Commonly known as: MYLICON Chew 1 tablet (80 mg total) by mouth as needed for flatulence.   witch hazel-glycerin pad Commonly known as: TUCKS Apply 1 application topically continuous.        Follow-up [facility]. Schedule an appointment as soon as possible for a visit in 2 week(s).   Why: mood check Contact information: [place]  [place], [place], CNM. Schedule an appointment as soon as possible for a visit in 6 week(s).   Specialty: Certified Nurse Midwife Why: postpartum visit  Contact information: [place] [address] [phone]               Signed: Francetta Found, [place] 09/20/2020 8:24 AM

## 2020-09-19 NOTE — Anesthesia Postprocedure Evaluation (Signed)
Anesthesia Post Note  Patient: Vanessa Zavala  Procedure(s) Performed: AN AD HOC LABOR EPIDURAL  Patient location during evaluation: Mother Baby Anesthesia Type: Epidural Level of consciousness: awake, oriented and awake and alert Pain management: pain level controlled Vital Signs Assessment: post-procedure vital signs reviewed and stable Respiratory status: spontaneous breathing, nonlabored ventilation and respiratory function stable Cardiovascular status: blood pressure returned to baseline and stable Postop Assessment: no headache and no backache Anesthetic complications: no   No complications documented.   Last Vitals:  Vitals:   09/19/20 0152 09/19/20 0258  BP: 112/67 103/66  Pulse: 69 63  Resp: 18 18  Temp: 36.7 C 36.7 C  SpO2: 98% 97%    Last Pain:  Vitals:   09/19/20 0258  TempSrc: Oral  PainSc:                  Ginger Carne

## 2020-09-19 NOTE — Progress Notes (Signed)
Post Partum Day 1 Subjective: Doing well, no complaints.  Tolerating regular diet, pain with PO meds, voiding and ambulating without difficulty.  No CP SOB Fever,Chills, N/V or leg pain; denies nipple or breast pain no HA change of vision, RUQ/epigastric pain  Objective: BP 114/73 (BP Location: Right Arm)   Pulse 82   Temp 99 F (37.2 C) (Axillary)   Resp 18   Ht $R'5\' 11"'Ap$  (1.803 m)   Wt 102.1 kg   LMP 11/25/2019 (Approximate)   SpO2 96%   Breastfeeding Unknown   BMI 31.38 kg/m    Physical Exam:  General: NAD Breasts: soft/nontender CV: RRR Pulm: nl effort, CTABL Abdomen: soft, NT, BS x 4 Perineum: mild edema, laceration repair well approximated Lochia: moderate Uterine Fundus: fundus firm and 1 fb below umbilicus DVT Evaluation: no cords, ttp LEs   Recent Labs    09/18/20 1432 09/19/20 0400  HGB 12.6 11.1*  HCT 36.3 31.5*  WBC 17.2* 21.9*  PLT 233 215    Assessment/Plan: 25 y.o. G1P1001 postpartum day # 1  - Continue routine PP care - encouraged snug fitting bra and cabbage leaves for bottlefeeding.  - Discussed contraceptive options including implant, IUDs hormonal and non-hormonal, injection, pills/ring/patch, condoms, and NFP. Pt plans aabstinence for now.   - WBCs- leukocytosis, will repeat CBC in am. Afebrile.  - Immunization status: Needs MMR prior to DC    Disposition: Does not desire Dc home today.     [place], CNM 09/19/2020  1:32 PM

## 2020-09-20 LAB — FETAL SCREEN: Fetal Screen: NEGATIVE

## 2020-09-20 MED ORDER — SIMETHICONE 80 MG PO CHEW
80.0000 mg | CHEWABLE_TABLET | ORAL | 0 refills | Status: DC | PRN
Start: 2020-09-20 — End: 2021-03-27

## 2020-09-20 MED ORDER — LEVOTHYROXINE SODIUM 75 MCG PO TABS: 75.0000 ug | ORAL_TABLET | Freq: Every day | ORAL | 0 refills | Status: DC

## 2020-09-20 MED ORDER — DOCUSATE SODIUM 100 MG PO CAPS: 100.0000 mg | ORAL_CAPSULE | Freq: Two times a day (BID) | ORAL | 0 refills | Status: DC

## 2020-09-20 MED ORDER — ACETAMINOPHEN 500 MG PO TABS: 1000.0000 mg | ORAL_TABLET | Freq: Four times a day (QID) | ORAL | 0 refills | Status: DC | PRN

## 2020-09-20 MED ORDER — IBUPROFEN 600 MG PO TABS: 600.0000 mg | ORAL_TABLET | Freq: Four times a day (QID) | ORAL | 0 refills | Status: DC

## 2020-09-20 MED ORDER — RHO D IMMUNE GLOBULIN 1500 UNIT/2ML IJ SOSY
300.0000 ug | PREFILLED_SYRINGE | Freq: Once | INTRAMUSCULAR | Status: AC
  Administered 2020-09-20: 300 ug via INTRAVENOUS
  Filled 2020-09-20: qty 2

## 2020-09-20 MED ORDER — BENZOCAINE-MENTHOL 20-0.5 % EX AERO: 1.0000 "application " | INHALATION_SPRAY | CUTANEOUS | Status: DC | PRN

## 2020-09-20 MED ORDER — WITCH HAZEL-GLYCERIN EX PADS: 1.0000 "application " | MEDICATED_PAD | CUTANEOUS | 12 refills | Status: DC

## 2020-09-20 MED ORDER — DIBUCAINE (PERIANAL) 1 % EX OINT
1.0000 | TOPICAL_OINTMENT | CUTANEOUS | 0 refills | Status: DC | PRN
Start: 2020-09-20 — End: 2021-03-27

## 2020-09-20 MED ORDER — COCONUT OIL OIL: 1.0000 "application " | TOPICAL_OIL | 0 refills | Status: DC | PRN

## 2020-09-20 NOTE — Progress Notes (Signed)
Mother discharged.  Discharge instructions given.  Mother verbalizes understanding.  Transported by auxiliary.  

## 2020-09-20 NOTE — TOC Transition Note (Signed)
Transition of Care [facility]) - CM/SW Discharge Note   Patient Details  Name: Vanessa Zavala MRN: [id number] Date of Birth: [birth year]  Transition [facility]) CM/SW Contact:  Leming Cellar, RN Phone Number: 09/20/2020, 8:55 AM   Clinical Narrative:     Patient with transportation home and no additional needs. TOC signing off.         Patient Goals and CMS Choice        Discharge Placement                       Discharge Plan and Services                                     Social Determinants of Health (SDOH) Interventions     Readmission Risk Interventions No flowsheet data found.

## 2020-09-20 NOTE — TOC Initial Note (Signed)
Transition of Care [facility]) - Initial/Assessment Note    Patient Details  Name: Vanessa Zavala MRN: [id number] Date of Birth: 06/06/1996  Transition of Care [facility]) CM/SW Contact:    Guinica Cellar, RN Phone Number: 09/20/2020, 8:48 AM  Clinical Narrative:                 Spoke with patient and her mother. Patient was holding infant Vonna Kotyk) in bed and eager to discharge home. Patient reports she is planning to bottlefeed and is already active with [facility] services. Has all needed equipment including car seat and bassinet. Reports strong support system and lives with her family. Has history of depression however is active with her PCP to manage the symptoms. Discussed PPD in detail including warning signs and how/where to seek treatment. Reports no smokers in the home and no need for daycare as patient works from her home and will have infant with her during that time. No needs with transportation and pediatrician is Dr. Eustaquio Boyden in [place]. No needs from TOC.          Patient Goals and CMS Choice        Expected Discharge Plan and Services           Expected Discharge Date: 09/19/20                                    Prior Living Arrangements/Services                       Activities of Daily Living Home Assistive Devices/Equipment: None ADL Screening (condition at time of admission) Patient's cognitive ability adequate to safely complete daily activities?: Yes Is the patient deaf or have difficulty hearing?: No Does the patient have difficulty seeing, even when wearing glasses/contacts?: No Does the patient have difficulty concentrating, remembering, or making decisions?: No Patient able to express need for assistance with ADLs?: Yes Does the patient have difficulty dressing or bathing?: No Independently performs ADLs?: Yes (appropriate for developmental age) Does the patient have difficulty walking or climbing stairs?: No Weakness of Legs: None Weakness of  Arms/Hands: None  Permission Sought/Granted                  Emotional Assessment              Admission diagnosis:  Labor and delivery, indication for care [O75.9] Patient Active Problem List   Diagnosis Date Noted  . Uterine size date discrepancy, third trimester 09/19/2020  . Labor and delivery, indication for care 09/18/2020  . Supervision of normal pregnancy 02/23/2020  . Type O blood, Rh negative 01/19/2020  . Thyroiditis, autoimmune 04/14/2019  . 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 Goiter   . Hypothyroidism 11/29/2010   PCP:  Patient, No Pcp Per Pharmacy:   [facility] [address] [place] - [address] [facility] [place] [address] Phone: [phone] Fax: [phone]  [facility] [address], [place] - [address] [place] [address] Phone: [phone] Fax: [phone]  [facility] [address], [place] - [address] [place] [address] Phone: [phone] Fax: [phone]  [facility] [address] [place] [place] - [address] [place] [address] Phone: [phone] Fax: [phone]  [id number] [place], [place] - [address] [place]  [place] [address] Phone: [phone] Fax: [phone]  [facility] [address] [place] - [address] [place] [address] Phone: [phone] Fax: [phone]  [facility] [address], [place] - [address] [place] [address] Phone: [phone] Fax: [phone]     Social Determinants of Health (SDOH) Interventions    Readmission Risk Interventions No flowsheet data found.

## 2020-09-20 NOTE — Discharge Instructions (Signed)
Discharge Instructions:   Follow-up Appointment:  If there are any new medications, they have been ordered and will be available for pickup at the listed pharmacy on your way home from the hospital.   Call office if you have any of the following: headache, visual changes, fever >101.0 F, chills, shortness of breath, breast concerns, excessive vaginal bleeding, incision drainage or problems, leg pain or redness, depression or any other concerns. If you have vaginal discharge with an odor, let your doctor know.   It is normal to bleed for up to 6 weeks. You should not soak through more than 1 pad in 1 hour. If you have a blood clot larger than your fist with continued bleeding, call your doctor.   Activity: Do not lift > 10 lbs for 6 weeks (do not lift anything heavier than your baby). No intercourse, tampons, swimming pools, hot tubs, baths (only showers) for 6 weeks.  No driving for 1-2 weeks. Continue prenatal vitamin, especially if breastfeeding. Increase calories and fluids (water) while breastfeeding.   Your milk will come in, in the next couple of days (right now it is colostrum). You may have a slight fever when your milk comes in, but it should go away on its own.  If it does not, and rises above 101 F please call the doctor. You will also feel achy and your breasts will be firm. They will also start to leak. If you are breastfeeding, continue as you have been and you can pump/express milk for comfort.   If you have too much milk, your breasts can become engorged, which could lead to mastitis. This is an infection of the milk ducts. It can be very painful and you will need to notify your doctor to obtain a prescription for antibiotics. You can also treat it with a shower or hot/cold compress.   For concerns about your baby, please call your pediatrician.  For breastfeeding concerns, the lactation consultant can be reached at [phone].   Postpartum blues (feelings of happy one minute  and sad another minute) are normal for the first few weeks but if it gets worse let your doctor know.   Congratulations! We enjoyed caring for you and your new bundle of joy!    Vaginal Delivery, Care After Refer to this sheet in the next few weeks. These discharge instructions provide you with information on caring for yourself after delivery. Your caregiver may also give you specific instructions. Your treatment has been planned according to the most current medical practices available, but problems sometimes occur. Call your caregiver if you have any problems or questions after you go home. HOME CARE INSTRUCTIONS 1. Take over-the-counter or prescription medicines only as directed by your caregiver or pharmacist. 2. Do not drink alcohol, especially if you are breastfeeding or taking medicine to relieve pain. 3. Do not smoke tobacco. 4. Continue to use good perineal care. Good perineal care includes: 1. Wiping your perineum from back to front 2. Keeping your perineum clean. 3. You can do sitz baths twice a day, to help keep this area clean 5. Do not use tampons, douche or have sex until your caregiver says it is okay. 6. Shower only and avoid sitting in submerged water, aside from sitz baths 7. Wear a well-fitting bra that provides breast support. 8. Eat healthy foods. 9. Drink enough fluids to keep your urine clear or pale yellow. 10. Eat high-fiber foods such as whole grain cereals and breads, brown rice, beans, and fresh fruits   and vegetables every day. These foods may help prevent or relieve constipation. 11. Avoid constipation with high fiber foods or medications, such as miralax or metamucil 12. Follow your caregiver's recommendations regarding resumption of activities such as climbing stairs, driving, lifting, exercising, or traveling. 13. Talk to your caregiver about resuming sexual activities. Resumption of sexual activities is dependent upon your risk of infection, your rate of  healing, and your comfort and desire to resume sexual activity. 14. Try to have someone help you with your household activities and your newborn for at least a few days after you leave the hospital. 15. Rest as much as possible. Try to rest or take a nap when your newborn is sleeping. 16. Increase your activities gradually. 17. Keep all of your scheduled postpartum appointments. It is very important to keep your scheduled follow-up appointments. At these appointments, your caregiver will be checking to make sure that you are healing physically and emotionally. SEEK MEDICAL CARE IF:   You are passing large clots from your vagina. Save any clots to show your caregiver.  You have a foul smelling discharge from your vagina.  You have trouble urinating.  You are urinating frequently.  You have pain when you urinate.  You have a change in your bowel movements.  You have increasing redness, pain, or swelling near your vaginal incision (episiotomy) or vaginal tear.  You have pus draining from your episiotomy or vaginal tear.  Your episiotomy or vaginal tear is separating.  You have painful, hard, or reddened breasts.  You have a severe headache.  You have blurred vision or see spots.  You feel sad or depressed.  You have thoughts of hurting yourself or your newborn.  You have questions about your care, the care of your newborn, or medicines.  You are dizzy or light-headed.  You have a rash.  You have nausea or vomiting.  You were breastfeeding and have not had a menstrual period within 12 weeks after you stopped breastfeeding.  You are not breastfeeding and have not had a menstrual period by the 12th week after delivery.  You have a fever. SEEK IMMEDIATE MEDICAL CARE IF:   You have persistent pain.  You have chest pain.  You have shortness of breath.  You faint.  You have leg pain.  You have stomach pain.  Your vaginal bleeding saturates two or more sanitary pads  in 1 hour. MAKE SURE YOU:   Understand these instructions.  Will watch your condition.  Will get help right away if you are not doing well or get worse. Document Released: 06/21/2000 Document Revised: 11/08/2013 Document Reviewed: 02/19/2012 ExitCare Patient Information 2015 [employer]. This information is not intended to replace advice given to you by your health care provider. Make sure you discuss any questions you have with your health care provider.  Sitz Bath A sitz bath is a warm water bath taken in the sitting position. The water covers only the hips and butt (buttocks). We recommend using one that fits in the toilet, to help with ease of use and cleanliness. It may be used for either healing or cleaning purposes. Sitz baths are also used to relieve pain, itching, or muscle tightening (spasms). The water may contain medicine. Moist heat will help you heal and relax.  HOME CARE  Take 3 to 4 sitz baths a day. 18. Fill the bathtub half-full with warm water. 19. Sit in the water and open the drain a little. 20. Turn on the warm water   to keep the tub half-full. Keep the water running constantly. 21. Soak in the water for 15 to 20 minutes. 22. After the sitz bath, pat the affected area dry. GET HELP RIGHT AWAY IF: You get worse instead of better. Stop the sitz baths if you get worse. MAKE SURE YOU:  Understand these instructions.  Will watch your condition.  Will get help right away if you are not doing well or get worse. Document Released: 08/01/2004 Document Revised: 03/18/2012 Document Reviewed: 10/22/2010 ExitCare Patient Information 2015 [employer]. This information is not intended to replace advice given to you by your health care provider. Make sure you discuss any questions you have with your health care provider.    

## 2020-09-21 LAB — SURGICAL PATHOLOGY

## 2020-09-21 LAB — RHOGAM INJECTION: Unit division: 0

## 2020-10-05 DIAGNOSIS — Z1332 Encounter for screening for maternal depression: Secondary | ICD-10-CM | POA: Diagnosis not present

## 2020-10-05 DIAGNOSIS — Z8659 Personal history of other mental and behavioral disorders: Secondary | ICD-10-CM | POA: Diagnosis not present

## 2020-10-05 DIAGNOSIS — O99893 Other specified diseases and conditions complicating puerperium: Secondary | ICD-10-CM | POA: Diagnosis not present

## 2020-12-04 IMAGING — US US PELVIS COMPLETE TRANSABD/TRANSVAG W DUPLEX
1 series · 13 of 25 positions shown · non-contrast
Comparison: CT urogram 08/11/2017, pelvic radiograph 08/13/2018

CLINICAL DATA: Left pelvic pain for several months, patient reports
history of PCOS

EXAM:
TRANSABDOMINAL AND TRANSVAGINAL ULTRASOUND OF PELVIS
DOPPLER ULTRASOUND OF OVARIES
TECHNIQUE: Both transabdominal and transvaginal ultrasound examinations of the
pelvis were performed. Transabdominal technique was performed for
global imaging of the pelvis including uterus, ovaries, adnexal
regions, and pelvic cul-de-sac.
It was necessary to proceed with endovaginal exam following the
transabdominal exam to visualize the left and right ovaries. Color
and duplex Doppler ultrasound was utilized to evaluate blood flow to
the ovaries.

[Series 1: us pelvic complete w transvaginal and torsion righ · 13 of 93 slices shown]
[im 1/93]
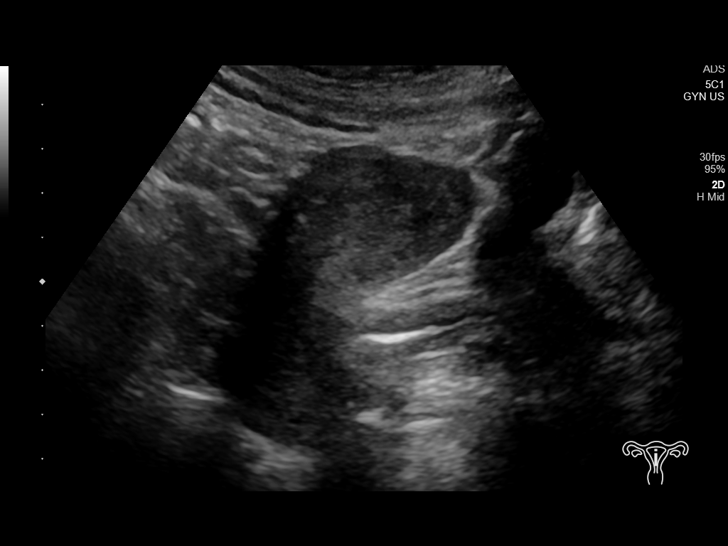
[im 8/93]
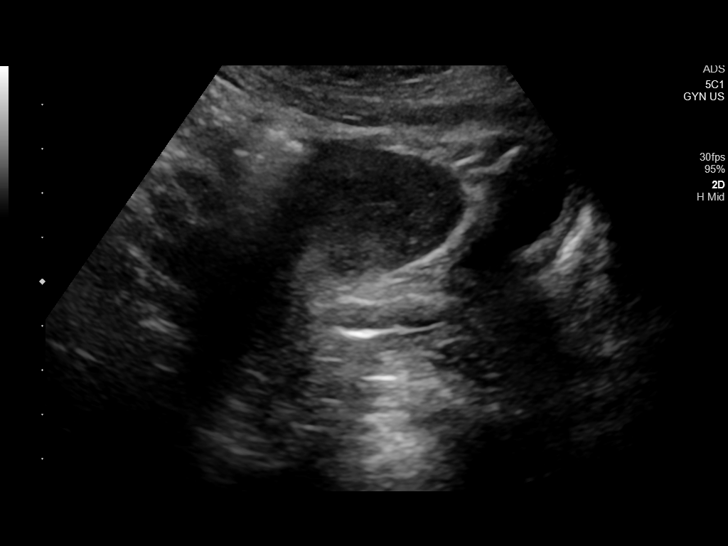
[im 16/93]
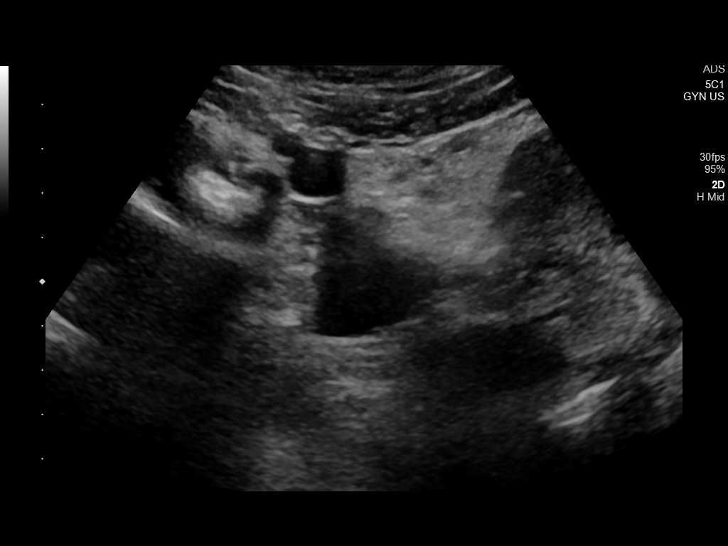
[im 24/93]
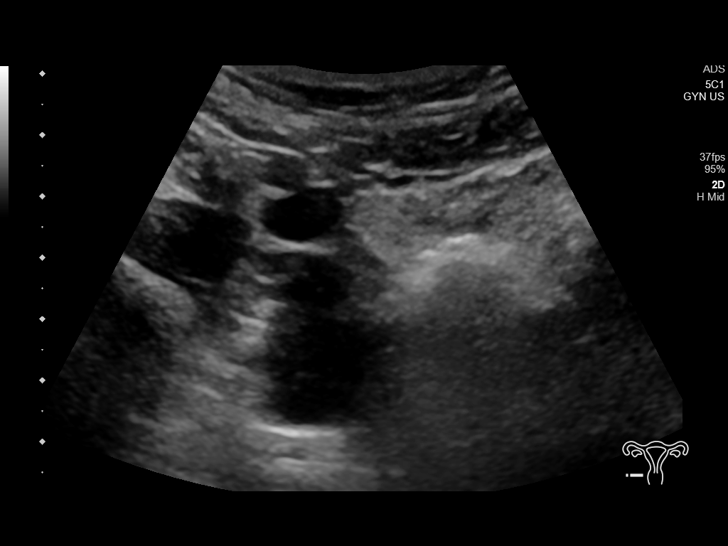
[im 31/93]
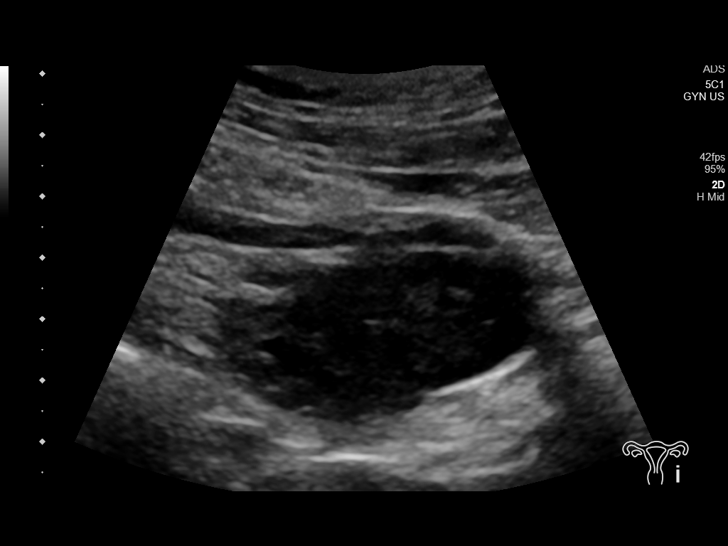
[im 39/93]
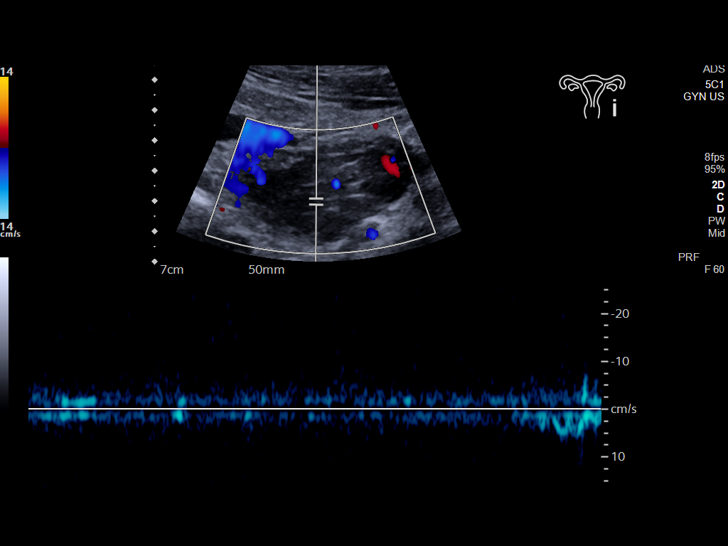
[im 47/93]
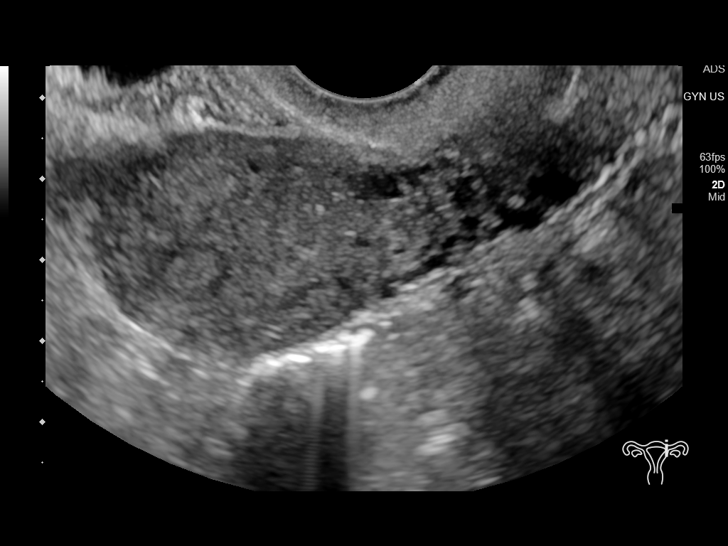
[im 54/93]
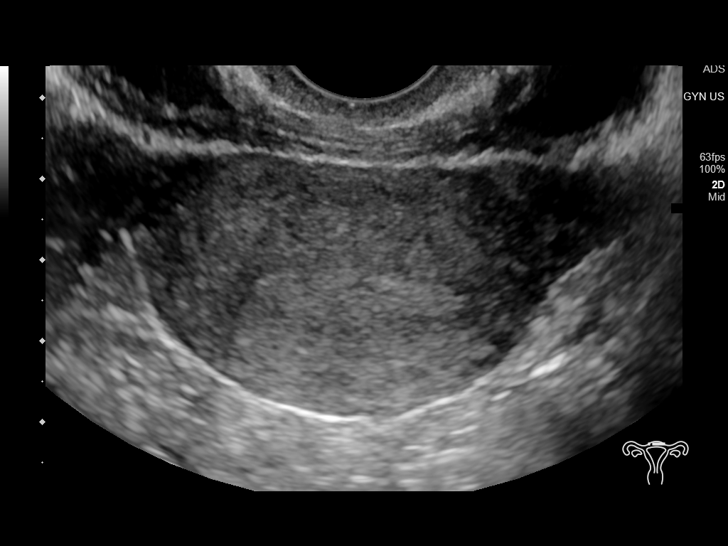
[im 62/93]
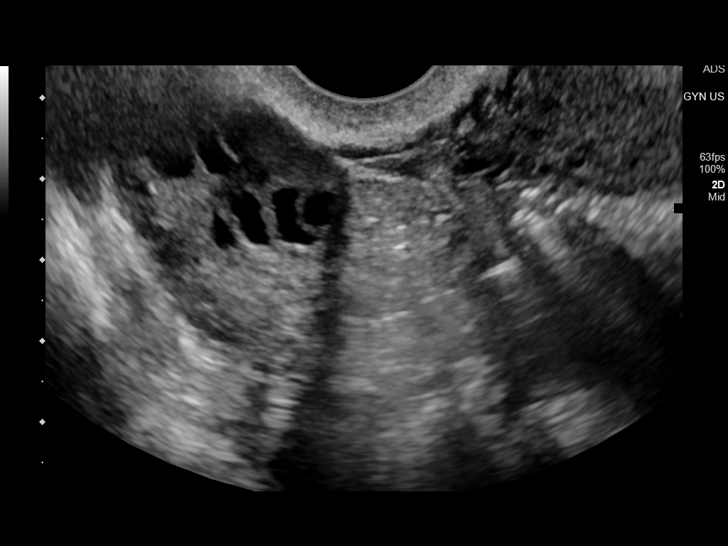
[im 70/93]
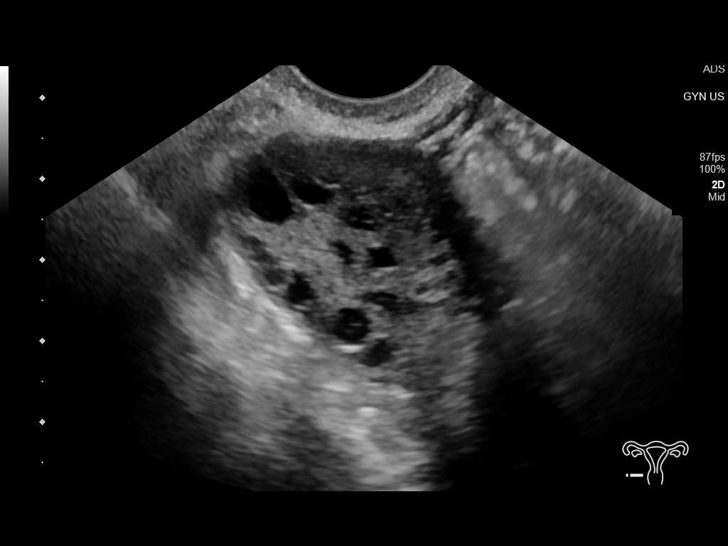
[im 77/93]
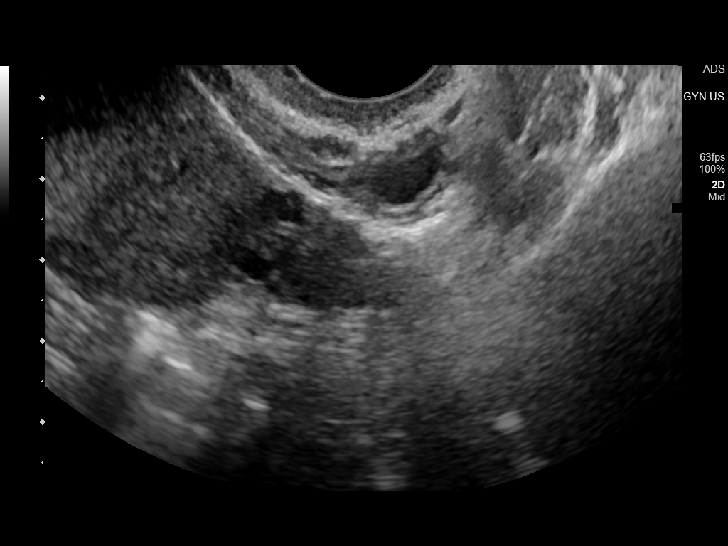
[im 85/93]
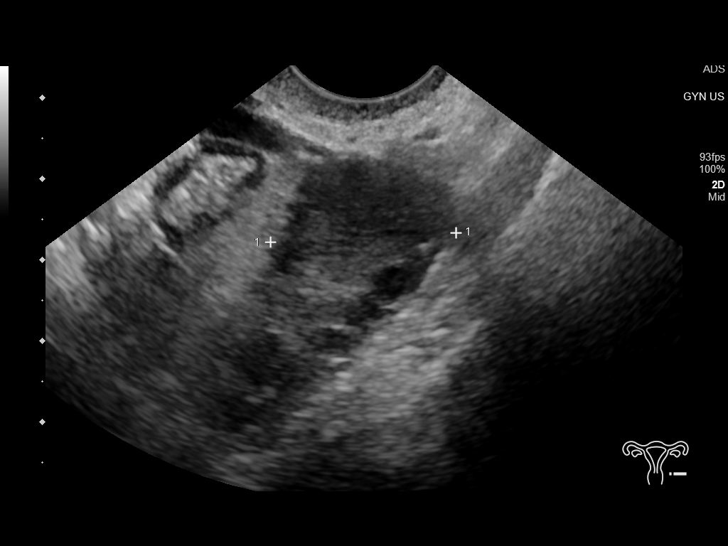
[im 93/93]
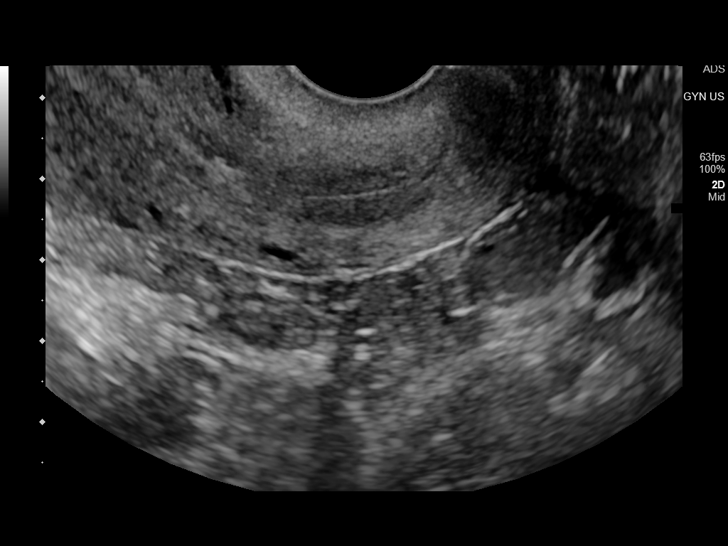

[13 of 25 positions shown; findings below may reference images not displayed]

FINDINGS: Uterus

Measurements: 7.8 x 3.3 x 4.8 cm = volume: 64.1 mL. No fibroids or
other mass visualized.

Endometrium

Thickness: 7 mm, within normal limits for reproductive age female.
No focal abnormality visualized.

Right ovary

Measurements: 5.3 x 2.5 x 2.9 cm = volume: 20.3 mL mL. Numerous
anechoic follicle situated predominantly along the periphery of the
ovary with some central hyperechogenicity, a typical appearance seen
with PCOS. No concerning adnexal lesion.

Left ovary

Measurements: 4.6 x 1.9 x 2.3 cm = volume: 10.5 mL. Centrally
hyperechoic ovary with numerous peripheral anechoic follicles,
appearance is typical of PCOS.

Pulsed Doppler evaluation of both ovaries demonstrates normal
low-resistance arterial and venous waveforms.

Other findings

No abnormal free fluid.
IMPRESSION: No acute pelvic abnormality is seen. No concerning adnexal lesions
or evidence of ovarian torsion.

Marginated follicles within the slightly hyperechoic ovaries are a
characteristic finding of PCOS.

## 2021-02-15 DIAGNOSIS — Z124 Encounter for screening for malignant neoplasm of cervix: Secondary | ICD-10-CM | POA: Diagnosis not present

## 2021-02-15 DIAGNOSIS — R87810 Cervical high risk human papillomavirus (HPV) DNA test positive: Secondary | ICD-10-CM | POA: Diagnosis not present

## 2021-02-15 DIAGNOSIS — Z8639 Personal history of other endocrine, nutritional and metabolic disease: Secondary | ICD-10-CM | POA: Diagnosis not present

## 2021-02-15 DIAGNOSIS — Z01419 Encounter for gynecological examination (general) (routine) without abnormal findings: Secondary | ICD-10-CM | POA: Diagnosis not present

## 2021-02-15 DIAGNOSIS — N898 Other specified noninflammatory disorders of vagina: Secondary | ICD-10-CM | POA: Diagnosis not present

## 2021-02-15 DIAGNOSIS — R300 Dysuria: Secondary | ICD-10-CM | POA: Diagnosis not present

## 2021-02-24 IMAGING — US US OB < 14 WEEKS - US OB TV
1 series · 15 of 28 positions shown · non-contrast
Comparison: Prior ultrasound from 10/29/2019.

CLINICAL DATA: Initial evaluation for acute lower abdominal and
back pain. Early pregnancy.

EXAM:
OBSTETRIC <14 WK US AND TRANSVAGINAL OB US
TECHNIQUE: Both transabdominal and transvaginal ultrasound examinations were
performed for complete evaluation of the gestation as well as the
maternal uterus, adnexal regions, and pelvic cul-de-sac.
Transvaginal technique was performed to assess early pregnancy.

[Series 1: us ob < 14 weeks - us ob tv · 15 of 69 slices shown]
[im 1/69]
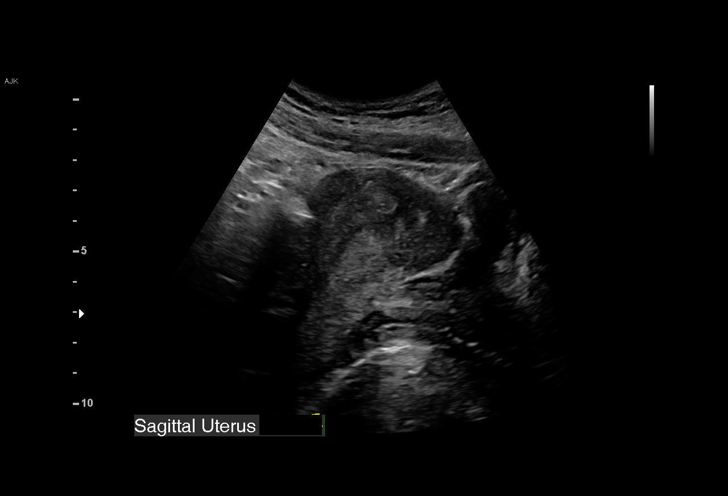
[im 6/69]
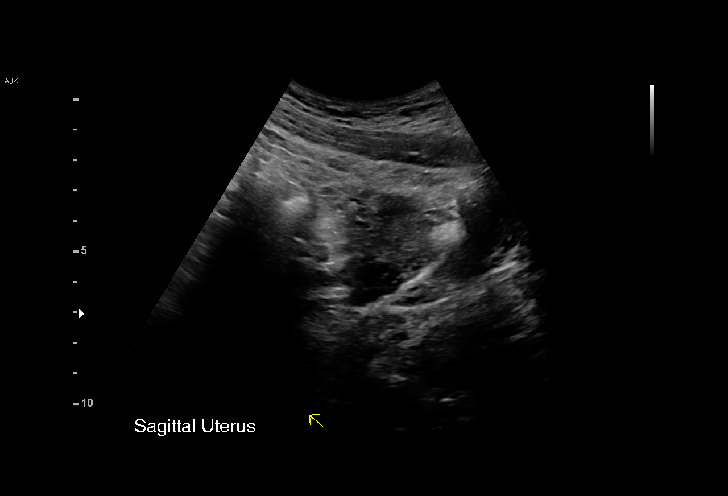
[im 11/69]
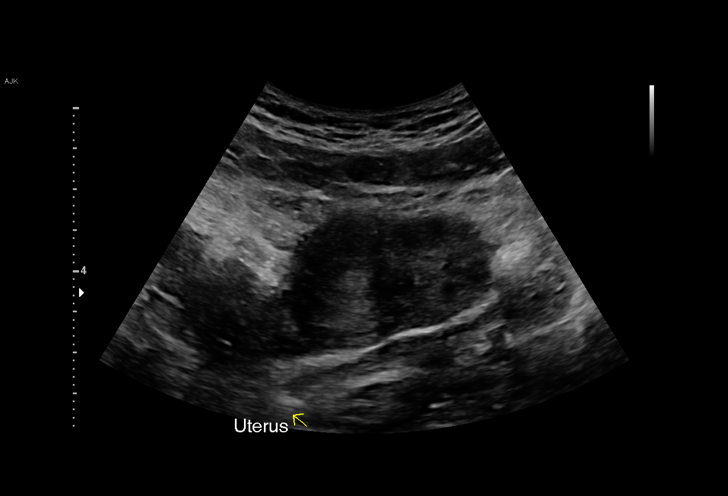
[im 16/69]
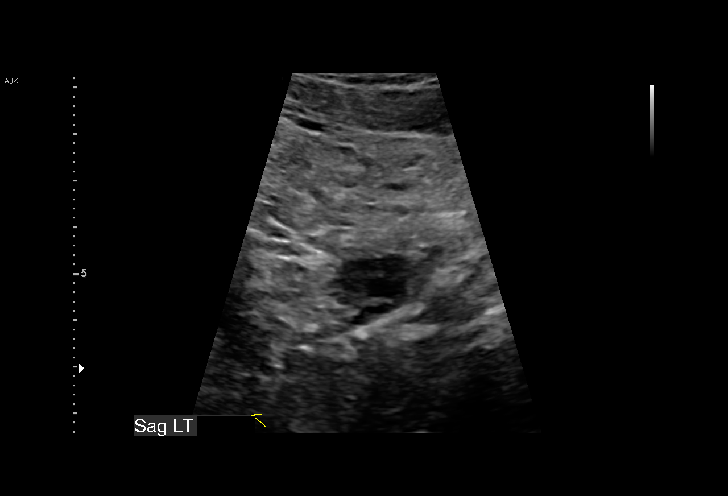
[im 21/69]
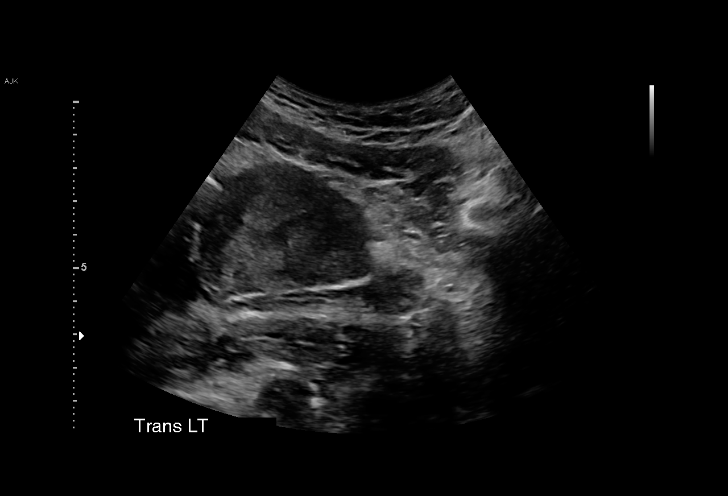
[im 26/69]
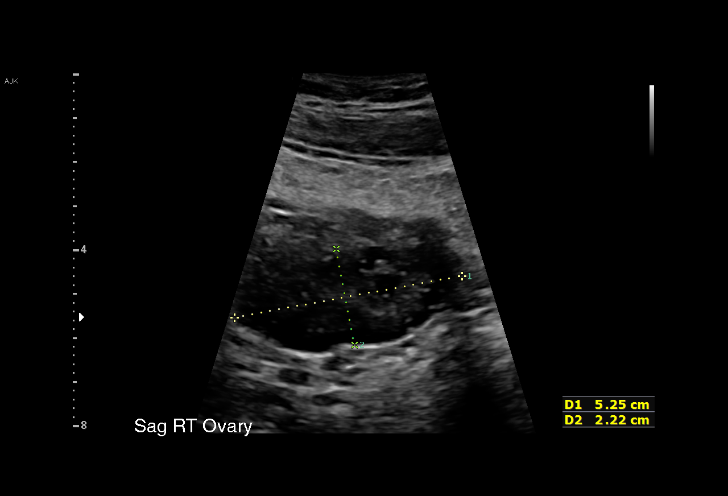
[im 31/69]
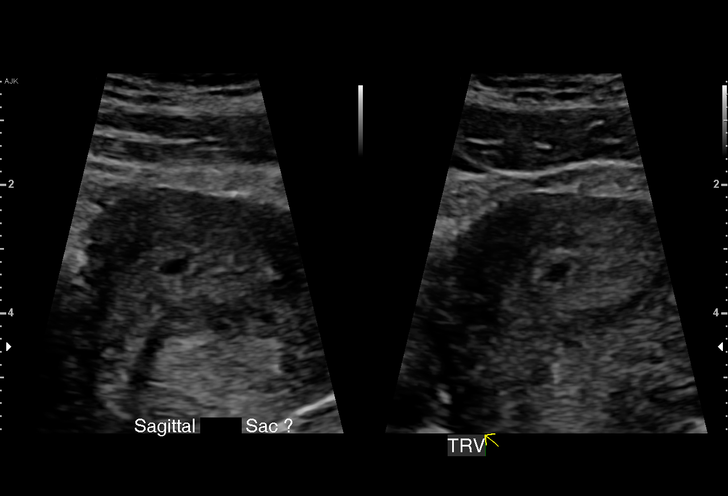
[im 36/69]
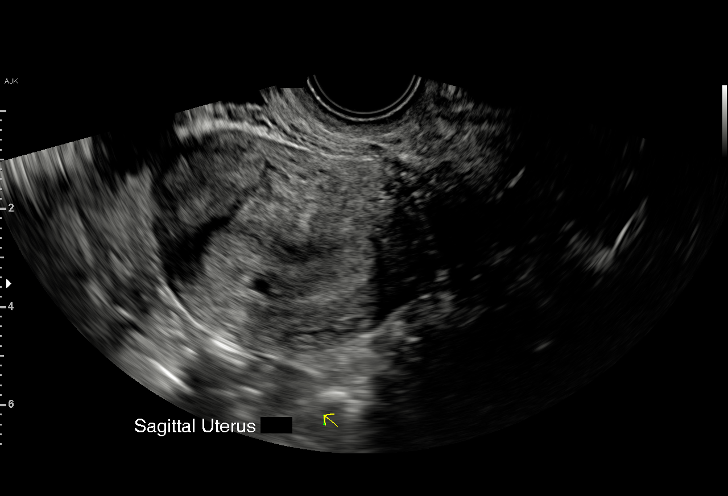
[im 38/69]
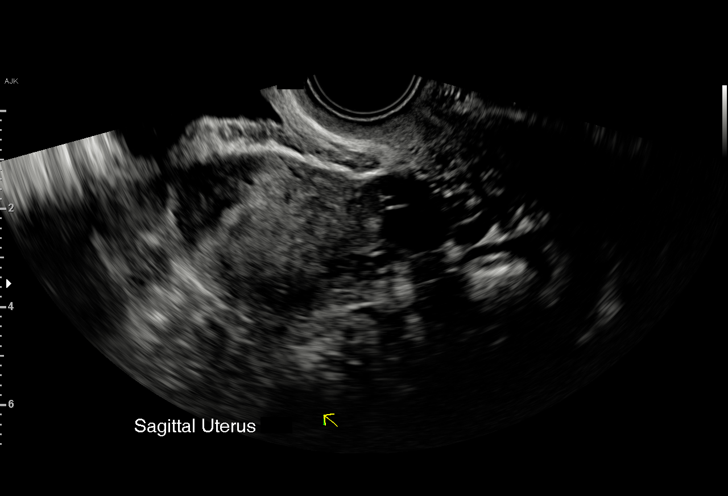
[im 43/69]
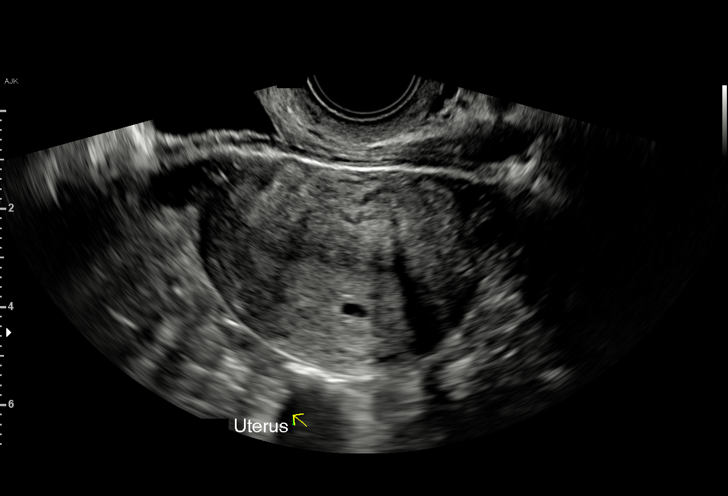
[im 48/69]
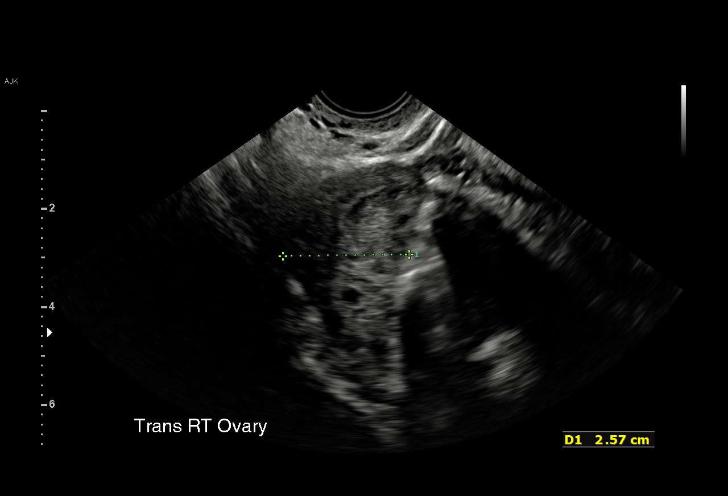
[im 53/69]
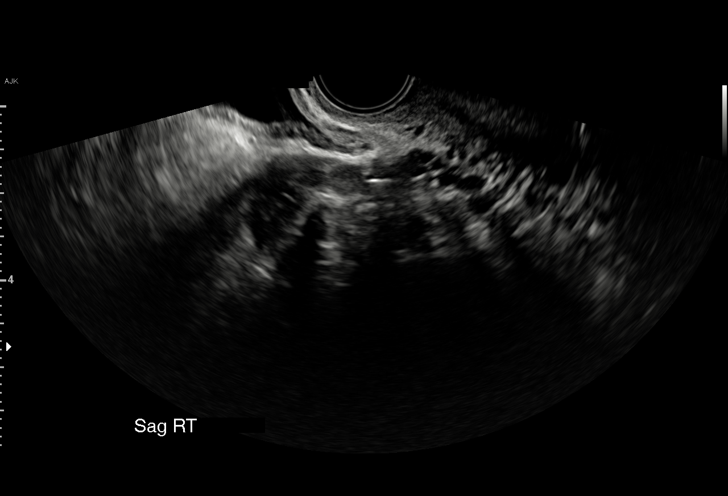
[im 58/69]
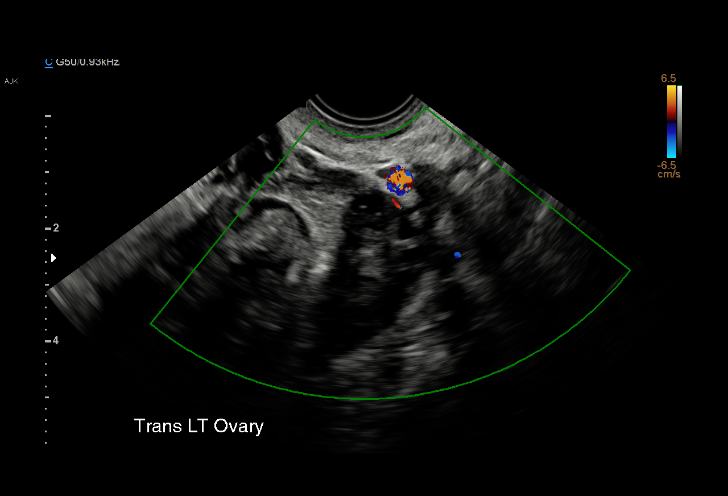
[im 63/69]
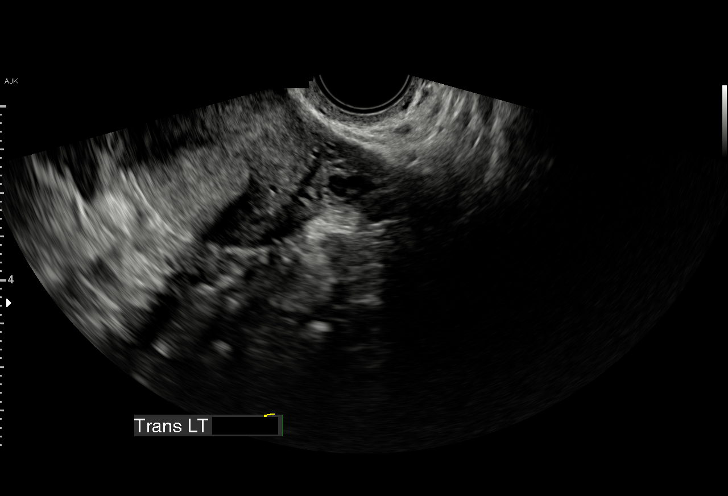
[im 69/69]
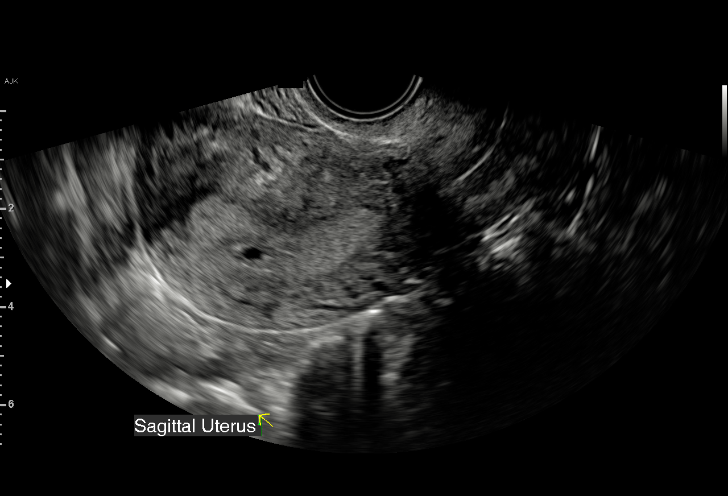

[15 of 28 positions shown; findings below may reference images not displayed]

FINDINGS: Intrauterine gestational sac: Single

Yolk sac:  Not definitely visualized at this time.

Embryo:  Negative.

Cardiac Activity: Negative.

MSD: 4.0 mm   5 w   1 d

Subchorionic hemorrhage:  None visualized.

Maternal uterus/adnexae: Ovaries are within normal limits
bilaterally. Small corpus luteal cyst noted on the right. No free
fluid or adnexal mass.
IMPRESSION: 1. Probable early intrauterine gestational sac, but no definite yolk
sac, fetal pole, or cardiac activity yet visualized. Recommend
follow-up quantitative B-HCG levels and follow-up US in 14 days to
confirm and assess viability. This recommendation follows SRU
consensus guidelines: Diagnostic Criteria for Nonviable Pregnancy
Early in the First Trimester. N Engl J Med 2734; [DATE].
2. No other acute maternal uterine or adnexal abnormality
identified.

## 2021-03-07 DIAGNOSIS — R102 Pelvic and perineal pain: Secondary | ICD-10-CM | POA: Diagnosis not present

## 2021-03-07 DIAGNOSIS — E039 Hypothyroidism, unspecified: Secondary | ICD-10-CM | POA: Diagnosis not present

## 2021-03-07 DIAGNOSIS — E282 Polycystic ovarian syndrome: Secondary | ICD-10-CM | POA: Diagnosis not present

## 2021-03-27 ENCOUNTER — Other Ambulatory Visit: Payer: Self-pay

## 2021-03-27 ENCOUNTER — Ambulatory Visit (INDEPENDENT_AMBULATORY_CARE_PROVIDER_SITE_OTHER): Payer: Medicaid Other | Admitting: Nurse Practitioner

## 2021-03-27 ENCOUNTER — Encounter: Payer: Self-pay | Admitting: Nurse Practitioner

## 2021-03-27 VITALS — BP 112/68 | HR 63 | Temp 98.4°F | Ht 71.0 in | Wt 208.6 lb

## 2021-03-27 DIAGNOSIS — F988 Other specified behavioral and emotional disorders with onset usually occurring in childhood and adolescence: Secondary | ICD-10-CM

## 2021-03-27 DIAGNOSIS — Z7689 Persons encountering health services in other specified circumstances: Secondary | ICD-10-CM | POA: Diagnosis not present

## 2021-03-27 DIAGNOSIS — E282 Polycystic ovarian syndrome: Secondary | ICD-10-CM | POA: Diagnosis not present

## 2021-03-27 DIAGNOSIS — R300 Dysuria: Secondary | ICD-10-CM | POA: Diagnosis not present

## 2021-03-27 DIAGNOSIS — Z0000 Encounter for general adult medical examination without abnormal findings: Secondary | ICD-10-CM | POA: Insufficient documentation

## 2021-03-27 LAB — POCT URINALYSIS DIP (CLINITEK)
Bilirubin, UA: NEGATIVE
Blood, UA: NEGATIVE
Glucose, UA: NEGATIVE mg/dL
Ketones, POC UA: NEGATIVE mg/dL
Leukocytes, UA: NEGATIVE
Nitrite, UA: NEGATIVE
POC PROTEIN,UA: NEGATIVE
Spec Grav, UA: 1.025 (ref 1.010–1.025)
Urobilinogen, UA: 0.2 E.U./dL
pH, UA: 6 (ref 5.0–8.0)

## 2021-03-27 MED ORDER — AMPHETAMINE-DEXTROAMPHETAMINE 10 MG PO TABS: 10.0000 mg | ORAL_TABLET | Freq: Two times a day (BID) | ORAL | 0 refills | Status: DC | PRN

## 2021-03-27 NOTE — Progress Notes (Signed)
New Patient Office Visit  Subjective:  Patient ID: Vanessa Zavala, female    DOB: December 13, 1995  Age: 25 y.o. MRN: [id number]  CC:  Chief Complaint  Patient presents with   New Patient (Initial Visit)    HPI Vanessa Zavala presents to establish new primary care provider. She has been seeing GYN provider for well-woman care and pap smear. She does have PCOS. Recently had new baby in march. She states that the baby's father passed away in Jun 23, 2023 while she was pregnant. She states that dealing with post partum and grief is difficult.  She states that she does have history of ADD. Was on Adderall in the past. Was taking 20mg  twice daily as needed. She states that she stopped taking this when she found out she was pregnant with her son. She states that now, she is having trouble keepig up with even small tasks in her life. She states that she started a new job July. She actually lost the job because she was unable to focus and concentrate during her training.  She states that she is working now. States that she has to work longer hours because it takes her lo ger to complete tasks and gt work done. She states that work is boring and uninteresting. She states that it takes her longer when she finds the work boring. The patient was given ASRS 1.1 today during her visit.   Adult ADHD Self Report Scale (most recent)     Adult ADHD Self-Report Scale (ASRS-v1.1) Symptom Checklist - 03/27/21 1548       Part A   1. How often do you have trouble wrapping up the final details of a project, once the challenging parts have been done? Very Often  2. How often do you have difficulty getting things done in order when you have to do a task that requires organization? Very Often    3. How often do you have problems remembering appointments or obligations? Often  4. When you have a task that requires a lot of thought, how often do you avoid or delay getting started? Very Often    5. How often do you fidget or  squirm with your hands or feet when you have to sit down for a long time? Very Often  6. How often do you feel overly active and compelled to do things, like you were driven by a motor? Often      Part B   7. How often do you make careless mistakes when you have to work on a boring or difficult project? Sometimes  8. How often do you have difficulty keeping your attention when you are doing boring or repetitive work? Very Often    9. How often do you have difficulty concentrating on what people say to you, even when they are speaking to you directly? Often  10. How often do you misplace or have difficulty finding things at home or at work? Often    11. How often are you distracted by activity or noise around you? Very Often  12. How often do you leave your seat in meetings or other situations in which you are expected to remain seated? Sometimes    13. How often do you feel restless or fidgety? Often  14. How often do you have difficulty unwinding and relaxing when you have time to yourself? Sometimes    15. How often do you find yourself talking too much when you are in social situations? Very  Often  16. When you are in a conversation, how often do you find yourself finishing the sentences of the people you are talking to, before they can finish them themselves? Sometimes    17. How often do you have difficulty waiting your turn in situations when turn taking is required? Often  18. How often do you interrupt others when they are busy? Sometimes      Comment   How old were you when these problems first began to occur? 16                 Past Medical History:  Diagnosis Date   Abdominal pain    Goiter    Hashimoto's disease    Hypothyroidism, acquired, autoimmune    Oligomenorrhea    PCOS (polycystic ovarian syndrome)    Thyroiditis, autoimmune     Past Surgical History:  Procedure Laterality Date   NO PAST SURGERIES      Family History  Problem Relation Age of Onset   Thyroid  disease Mother    Diabetes Maternal Grandmother    Cancer Maternal Grandmother    Thyroid disease Maternal Grandmother     Social History   Socioeconomic History   Marital status: Single    Spouse name: Not on file   Number of children: Not on file   Years of education: Not on file   Highest education level: Not on file  Occupational History   Not on file  Tobacco Use   Smoking status: Former    Types: E-cigarettes   Smokeless tobacco: Never  Vaping Use   Vaping Use: Every day  Substance and Sexual Activity   Alcohol use: Not Currently    Comment: occasionally   Drug use: Not Currently    Types: Marijuana   Sexual activity: Not Currently  Other Topics Concern   Not on file  Social History Narrative   Not on file   Social Determinants of Health   Financial Resource Strain: Not on file  Food Insecurity: Not on file  Transportation Needs: Not on file  Physical Activity: Not on file  Stress: Not on file  Social Connections: Not on file  Intimate Partner Violence: Not on file    ROS Review of Systems  Constitutional:  Negative for activity change, appetite change, chills, fatigue and fever.  HENT:  Negative for congestion, postnasal drip, rhinorrhea, sinus pressure, sinus pain, sneezing and sore throat.   Eyes: Negative.   Respiratory:  Negative for cough, chest tightness, shortness of breath and wheezing.   Cardiovascular:  Negative for chest pain and palpitations.  Gastrointestinal:  Negative for abdominal pain, constipation, diarrhea, nausea and vomiting.  Endocrine: Negative for cold intolerance, heat intolerance, polydipsia and polyuria.  Genitourinary:  Positive for dysuria. Negative for dyspareunia, flank pain, frequency and urgency.  Musculoskeletal:  Negative for arthralgias, back pain and myalgias.  Skin:  Negative for rash.  Allergic/Immunologic: Negative for environmental allergies.  Neurological:  Negative for dizziness, weakness and headaches.   Hematological:  Negative for adenopathy.  Psychiatric/Behavioral:  Positive for decreased concentration. The patient is nervous/anxious.    Objective:   Today's Vitals   03/27/21 1338  BP: 112/68  Pulse: 63  Temp: 98.4 F (36.9 C)  SpO2: 100%  Weight: 208 lb 9.6 oz (94.6 kg)  Height: 5\' 11"  (1.803 m)   Body mass index is 29.09 kg/m.   Physical Exam Vitals and nursing note reviewed.  Constitutional:      Appearance: Normal appearance. She  is well-developed.  HENT:     Head: Normocephalic and atraumatic.     Nose: Nose normal.  Eyes:     Pupils: Pupils are equal, round, and reactive to light.  Cardiovascular:     Rate and Rhythm: Normal rate and regular rhythm.     Pulses: Normal pulses.     Heart sounds: Normal heart sounds.  Pulmonary:     Effort: Pulmonary effort is normal.     Breath sounds: Normal breath sounds.  Abdominal:     Palpations: Abdomen is soft.  Musculoskeletal:        General: Normal range of motion.     Cervical back: Normal range of motion and neck supple.  Lymphadenopathy:     Cervical: No cervical adenopathy.  Skin:    General: Skin is warm and dry.     Capillary Refill: Capillary refill takes less than 2 seconds.  Neurological:     General: No focal deficit present.     Mental Status: She is alert and oriented to person, place, and time.  Psychiatric:        Mood and Affect: Mood normal.        Behavior: Behavior normal.        Thought Content: Thought content normal.        Judgment: Judgment normal.    Assessment & Plan:  1. Encounter to establish care Appointment today to establish new primary care provider.  2. PCOS (polycystic ovarian syndrome) Patient diagnosed with PCOS per gynecologist.  Patient continues to see a GYN provider for well woman exams and Pap smears.  3. Attention deficit disorder (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 Patient administered ASRS 1.1 during today's visit.  She scored 6 out of 6.  This is a strong indicator of adult onset  ADHD.  Start Adderall 10 mg tablets.  She may take 1/2 to 1 tablet up to twice daily as needed for focus and concentration.  Advised her she did not have to take full tablet, nor did she had to take twice daily.  This will be taken on as-needed basis only.  She voiced understanding and agreement.  Reviewed her PDMP profile.  Her overdose risk score is 260.  Last fill of any controlled medication was 06/2020.  This was for clonazepam 0.5 mg.  She was given prescription for 15 tablets after her fianc suddenly passed away.  She did sign a controlled substances agreement for [place] primary care at [facility] on 03/27/2021. - amphetamine-dextroamphetamine (ADDERALL) 10 MG tablet; Take 1 tablet (10 mg total) by mouth 2 (two) times daily as needed.  Dispense: 60 tablet; Refill: 0  4. Dysuria Urine sample was negative for infection or other abnormalities today.  Advised patient to increase water intake.  She should notify office if symptoms worsen or do not improve over the next several days. - POCT URINALYSIS DIP (CLINITEK)   Problem List Items Addressed This Visit       Endocrine   PCOS (polycystic ovarian syndrome)     Other   Encounter to establish care - Primary   Attention deficit disorder (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   Relevant Medications   amphetamine-dextroamphetamine (ADDERALL) 10 MG tablet   Dysuria   Relevant Orders   POCT URINALYSIS DIP (CLINITEK) (Completed)    Outpatient Encounter Medications as of 03/27/2021  Medication Sig   amphetamine-dextroamphetamine (ADDERALL) 10 MG tablet Take 1 tablet (10 mg total) by mouth 2 (two) times daily as needed.   levonorgestrel-ethinyl estradiol (LYBREL) 90-20  MCG tablet Take 1 tablet by mouth daily.   [DISCONTINUED] acetaminophen (TYLENOL) 500 MG tablet Take 2 tablets (1,000 mg total) by mouth every 6 (six) hours as needed (for pain scale < 4).   [DISCONTINUED] albuterol (VENTOLIN HFA) 108 (90 Base) MCG/ACT inhaler Inhale 1-2 puffs into the lungs every  6 (six) hours as needed for wheezing or shortness of breath.   [DISCONTINUED] benzocaine-Menthol (DERMOPLAST) 20-0.5 % AERO Apply 1 application topically as needed for irritation (perineal discomfort).   [DISCONTINUED] coconut oil OIL Apply 1 application topically as needed.   [DISCONTINUED] dibucaine (NUPERCAINAL) 1 % OINT Place 1 application rectally as needed for hemorrhoids.   [DISCONTINUED] docusate sodium (COLACE) 100 MG capsule Take 1 capsule (100 mg total) by mouth 2 (two) times daily.   [DISCONTINUED] fluticasone (FLONASE) 50 MCG/ACT nasal spray Place 1 spray into both nostrils daily.   [DISCONTINUED] ibuprofen (ADVIL) 600 MG tablet Take 1 tablet (600 mg total) by mouth every 6 (six) hours.   [DISCONTINUED] levothyroxine (SYNTHROID) 75 MCG tablet Take 1 tablet (75 mcg total) by mouth daily at 6 (six) AM.   [DISCONTINUED] Prenatal Vit-Fe Fumarate-FA (PRENATAL MULTIVITAMIN) TABS tablet Take 1 tablet by mouth daily at 12 noon.   [DISCONTINUED] simethicone (MYLICON) 80 MG chewable tablet Chew 1 tablet (80 mg total) by mouth as needed for flatulence.   [DISCONTINUED] witch hazel-glycerin (TUCKS) pad Apply 1 application topically continuous.   No facility-administered encounter medications on file as of 03/27/2021.   This note was dictated using Conservation officer, historic buildings. Rapid proofreading was performed to expedite the delivery of the information. Despite proofreading, phonetic errors will occur which are common with this voice recognition software. Please take this into consideration. If there are any concerns, please contact our office.    Follow-up: No follow-ups on file.   Carlean Jews, NP

## 2021-04-26 ENCOUNTER — Encounter: Payer: Self-pay | Admitting: Nurse Practitioner

## 2021-04-26 ENCOUNTER — Ambulatory Visit (INDEPENDENT_AMBULATORY_CARE_PROVIDER_SITE_OTHER): Payer: Medicaid Other | Admitting: Nurse Practitioner

## 2021-04-26 ENCOUNTER — Other Ambulatory Visit: Payer: Self-pay

## 2021-04-26 VITALS — BP 128/83 | HR 78 | Temp 98.0°F | Ht 71.0 in | Wt 208.6 lb

## 2021-04-26 DIAGNOSIS — F988 Other specified behavioral and emotional disorders with onset usually occurring in childhood and adolescence: Secondary | ICD-10-CM

## 2021-04-26 DIAGNOSIS — Z23 Encounter for immunization: Secondary | ICD-10-CM | POA: Diagnosis not present

## 2021-04-26 DIAGNOSIS — Z6829 Body mass index (BMI) 29.0-29.9, adult: Secondary | ICD-10-CM | POA: Diagnosis not present

## 2021-04-26 DIAGNOSIS — N926 Irregular menstruation, unspecified: Secondary | ICD-10-CM | POA: Insufficient documentation

## 2021-04-26 DIAGNOSIS — Z30016 Encounter for initial prescription of transdermal patch hormonal contraceptive device: Secondary | ICD-10-CM

## 2021-04-26 MED ORDER — AMPHETAMINE-DEXTROAMPHETAMINE 10 MG PO TABS: 10.0000 mg | ORAL_TABLET | Freq: Two times a day (BID) | ORAL | 0 refills | Status: DC | PRN

## 2021-04-26 MED ORDER — NORELGESTROMIN-ETH ESTRADIOL 150-35 MCG/24HR TD PTWK: 1.0000 | MEDICATED_PATCH | TRANSDERMAL | 12 refills | Status: DC

## 2021-04-26 NOTE — Progress Notes (Signed)
Established Patient Office Visit  Subjective:  Patient ID: Vanessa Zavala, female    DOB: 1995-07-10  Age: 25 y.o. MRN: [id number]  CC:  Chief Complaint  Patient presents with   Follow-up   ADHD    HPI Vanessa Zavala presents for follow up visit. She was started on adderall 10mg  up to twice daily as needed for focus and concentration at her most recent visit with me. She states that this medication has improved her work life a great deal. She states that her supervisor has congratulated her on doing a better job the past few weeks. The patient states that she is able to get work done, stay focused and on track, even on days which are close to 12 hours long. She states that she also likes the "as needed" directions because there are some days which are shorter and she does not need to take two doses and some days when she doesn't have to take it at all. She denies negative side effects associated with taking this medication.  She states that she would like to try different option for birth control. States that the pills she has recently prescribed caused her to have menstrual cycle many times in one month. She states that after the third time, she just stopped taking it. Last episode of bleeding was last week. Has not had bleeding since stopping the pill.  Would like to get her flu shot today.   Past Medical History:  Diagnosis Date   Abdominal pain    Goiter    Hashimoto's disease    Hypothyroidism, acquired, autoimmune    Oligomenorrhea    PCOS (polycystic ovarian syndrome)    Thyroiditis, autoimmune     Past Surgical History:  Procedure Laterality Date   NO PAST SURGERIES      Family History  Problem Relation Age of Onset   Thyroid disease Mother    Diabetes Maternal Grandmother    Cancer Maternal Grandmother    Thyroid disease Maternal Grandmother     Social History   Socioeconomic History   Marital status: Single    Spouse name: Not on file   Number of children:  Not on file   Years of education: Not on file   Highest education level: Not on file  Occupational History   Not on file  Tobacco Use   Smoking status: Former    Types: E-cigarettes   Smokeless tobacco: Never  Vaping Use   Vaping Use: Every day  Substance and Sexual Activity   Alcohol use: Not Currently    Comment: occasionally   Drug use: Not Currently    Types: Marijuana   Sexual activity: Not Currently  Other Topics Concern   Not on file  Social History Narrative   Not on file   Social Determinants of Health   Financial Resource Strain: Not on file  Food Insecurity: Not on file  Transportation Needs: Not on file  Physical Activity: Not on file  Stress: Not on file  Social Connections: Not on file  Intimate Partner Violence: Not on file    Outpatient Medications Prior to Visit  Medication Sig Dispense Refill   amphetamine-dextroamphetamine (ADDERALL) 10 MG tablet Take 1 tablet (10 mg total) by mouth 2 (two) times daily as needed. 60 tablet 0   levonorgestrel-ethinyl estradiol (LYBREL) 90-20 MCG tablet Take 1 tablet by mouth daily. (Patient not taking: Reported on 04/26/2021)     No facility-administered medications prior to visit.    No  Known Allergies  ROS Review of Systems  Constitutional:  Negative for activity change, appetite change, chills, fatigue and fever.  HENT:  Negative for congestion, postnasal drip, rhinorrhea, sinus pressure, sinus pain, sneezing and sore throat.   Eyes: Negative.   Respiratory:  Negative for cough, chest tightness, shortness of breath and wheezing.   Cardiovascular:  Negative for chest pain and palpitations.  Gastrointestinal:  Negative for abdominal pain, constipation, diarrhea, nausea and vomiting.  Endocrine: Negative for cold intolerance, heat intolerance, polydipsia and polyuria.  Genitourinary:  Negative for dyspareunia, dysuria, flank pain, frequency and urgency.       Unusual bleeding with recent prescription of birth  control pills. Has stopped them and would like to start something else .  Musculoskeletal:  Negative for arthralgias, back pain and myalgias.  Skin:  Negative for rash.  Allergic/Immunologic: Negative for environmental allergies.  Neurological:  Negative for dizziness, weakness and headaches.  Hematological:  Negative for adenopathy.  Psychiatric/Behavioral:  Positive for dysphoric mood. The patient is not nervous/anxious.        Big improvement with focus and concentration since starting on adderall 10mg  twice daily as needed      Objective:    Physical Exam Vitals and nursing note reviewed.  Constitutional:      Appearance: Normal appearance. She is well-developed.  HENT:     Head: Normocephalic and atraumatic.     Nose: Nose normal.     Mouth/Throat:     Mouth: Mucous membranes are moist.  Eyes:     Extraocular Movements: Extraocular movements intact.     Conjunctiva/sclera: Conjunctivae normal.     Pupils: Pupils are equal, round, and reactive to light.  Cardiovascular:     Rate and Rhythm: Normal rate and regular rhythm.     Pulses: Normal pulses.     Heart sounds: Normal heart sounds.  Pulmonary:     Effort: Pulmonary effort is normal.     Breath sounds: Normal breath sounds.  Abdominal:     Palpations: Abdomen is soft.  Musculoskeletal:        General: Normal range of motion.     Cervical back: Normal range of motion and neck supple.  Lymphadenopathy:     Cervical: No cervical adenopathy.  Skin:    General: Skin is warm and dry.     Capillary Refill: Capillary refill takes less than 2 seconds.  Neurological:     General: No focal deficit present.     Mental Status: She is alert and oriented to person, place, and time.  Psychiatric:        Mood and Affect: Mood normal.        Behavior: Behavior normal.        Thought Content: Thought content normal.        Judgment: Judgment normal.   Today's Vitals   04/26/21 1413  BP: 128/83  Pulse: 78  Temp: 98 F (36.7  C)  SpO2: 100%  Weight: 208 lb 9.6 oz (94.6 kg)   Body mass index is 29.09 kg/m.   Wt Readings from Last 3 Encounters:  04/26/21 208 lb 9.6 oz (94.6 kg)  03/27/21 208 lb 9.6 oz (94.6 kg)  09/18/20 225 lb (102.1 kg)     Health Maintenance Due  Topic Date Due   COVID-19 Vaccine (1) Never done   HPV VACCINES (1 - 2-dose series) Never done   Hepatitis C Screening  Never done       Topic Date Due  HPV VACCINES (1 - 2-dose series) Never done    Lab Results  Component Value Date   TSH 0.80 07/28/2014   Lab Results  Component Value Date   WBC 21.9 (H) 09/19/2020   HGB 11.1 (L) 09/19/2020   HCT 31.5 (L) 09/19/2020   MCV 89.7 09/19/2020   PLT 215 09/19/2020   Lab Results  Component Value Date   NA 138 09/18/2020   K 3.7 09/18/2020   CO2 21 (L) 09/18/2020   GLUCOSE 99 09/18/2020   BUN 9 09/18/2020   CREATININE 0.63 09/18/2020   BILITOT 0.6 09/18/2020   ALKPHOS 119 09/18/2020   AST 20 09/18/2020   ALT 16 09/18/2020   PROT 6.6 09/18/2020   ALBUMIN 3.1 (L) 09/18/2020   CALCIUM 9.6 09/18/2020   ANIONGAP 7 09/18/2020   No results found for: CHOL No results found for: HDL No results found for: LDLCALC No results found for: TRIG No results found for: CHOLHDL No results found for: OIZT2W    Assessment & Plan:  1. Irregular menstrual cycle Patient reporting frequent menstrual periods on recent oral contraceptive. Discussed other contraceptive alternatives with patient and will do three month trial of transdermal birth control patch.   2. Encounter for initial prescription of transdermal patch hormonal contraceptive device Three month trial of birth control patch. Reviewed instructions for use, changing patch weekly for three weeks then leaving off for one month to allow for shedding of uterine lining. She voiced understanding and agreement.  - norelgestromin-ethinyl estradiol (ORTHO EVRA) 150-35 MCG/24HR transdermal patch; Place 1 patch onto the skin once a week.   Dispense: 3 patch; Refill: 12  3. Attention deficit disorder (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 Improved with current dose adderall 10mg  twicde daily as needed. Send three 30 day prescriptions to her pharmacy. Dates are 04/26/2021, 05/25/2021, and 06/22/2021.  - amphetamine-dextroamphetamine (ADDERALL) 10 MG tablet; Take 1 tablet (10 mg total) by mouth 2 (two) times daily as needed.  Dispense: 60 tablet; Refill: 0  4. Body mass index 29.0-29.9, adult Discussed lowering calorie intake to 1500 calories per day and incorporating exercise into daily routine to help lose weight. Will monitor.   5. Need for influenza vaccination Flu vaccine administered during today's visit  - Flu Vaccine QUAD 6+ mos PF IM (Fluarix Quad PF)   Problem List Items Addressed This Visit       Other   Attention deficit disorder (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   Relevant Medications   amphetamine-dextroamphetamine (ADDERALL) 10 MG tablet   Irregular menstrual cycle - Primary   Body mass index 29.0-29.9, adult   Other Visit Diagnoses     Encounter for initial prescription of transdermal patch hormonal contraceptive device       Relevant Medications   norelgestromin-ethinyl estradiol (ORTHO EVRA) 150-35 MCG/24HR transdermal patch   Need for influenza vaccination       Relevant Orders   Flu Vaccine QUAD 6+ mos PF IM (Fluarix Quad PF) (Completed)       Meds ordered this encounter  Medications   DISCONTD: amphetamine-dextroamphetamine (ADDERALL) 10 MG tablet    Sig: Take 1 tablet (10 mg total) by mouth 2 (two) times daily as needed.    Dispense:  60 tablet    Refill:  0    Order Specific Question:   Supervising Provider    Answer:   04-05-1986 D [[id number]]   norelgestromin-ethinyl estradiol (ORTHO EVRA) 150-35 MCG/24HR transdermal patch    Sig: Place 1 patch onto the skin once a week.  Dispense:  3 patch    Refill:  12    Order Specific Question:   Supervising Provider    Answer:   Nani Gasser D [[id number]]   DISCONTD:  amphetamine-dextroamphetamine (ADDERALL) 10 MG tablet    Sig: Take 1 tablet (10 mg total) by mouth 2 (two) times daily as needed.    Dispense:  60 tablet    Refill:  0    Fill after 05/25/2021    Order Specific Question:   Supervising Provider    Answer:   Nani Gasser D [[id number]]   amphetamine-dextroamphetamine (ADDERALL) 10 MG tablet    Sig: Take 1 tablet (10 mg total) by mouth 2 (two) times daily as needed.    Dispense:  60 tablet    Refill:  0    Fill after 06/22/2021    Order Specific Question:   Supervising Provider    Answer:   Nani Gasser D [[id number]]    Follow-up: Return in about 3 months (around 07/27/2021) for ADD.    Carlean Jews, NP

## 2021-04-26 NOTE — Patient Instructions (Signed)

## 2021-05-15 DIAGNOSIS — J209 Acute bronchitis, unspecified: Secondary | ICD-10-CM | POA: Diagnosis not present

## 2021-05-18 DIAGNOSIS — R059 Cough, unspecified: Secondary | ICD-10-CM | POA: Diagnosis not present

## 2021-05-18 DIAGNOSIS — B348 Other viral infections of unspecified site: Secondary | ICD-10-CM | POA: Diagnosis not present

## 2021-05-18 DIAGNOSIS — J209 Acute bronchitis, unspecified: Secondary | ICD-10-CM | POA: Diagnosis not present

## 2021-05-28 ENCOUNTER — Other Ambulatory Visit: Payer: Self-pay | Admitting: Nurse Practitioner

## 2021-05-28 DIAGNOSIS — F988 Other specified behavioral and emotional disorders with onset usually occurring in childhood and adolescence: Secondary | ICD-10-CM

## 2021-05-28 MED ORDER — AMPHETAMINE-DEXTROAMPHETAMINE 10 MG PO TABS: 10.0000 mg | ORAL_TABLET | Freq: Two times a day (BID) | ORAL | 0 refills | Status: DC | PRN

## 2021-05-28 NOTE — Telephone Encounter (Signed)
Patient requesting refill of Adderall. Pt last seen in October. AS, CMA

## 2021-05-28 NOTE — Telephone Encounter (Signed)
There shouls have been one in there for November as well. I have sent this. She should be able to fill December prescription without trouble.

## 2021-05-29 ENCOUNTER — Other Ambulatory Visit: Payer: Self-pay

## 2021-05-29 ENCOUNTER — Encounter: Payer: Self-pay | Admitting: Nurse Practitioner

## 2021-05-29 ENCOUNTER — Ambulatory Visit (INDEPENDENT_AMBULATORY_CARE_PROVIDER_SITE_OTHER): Payer: Medicaid Other | Admitting: Nurse Practitioner

## 2021-05-29 VITALS — BP 121/77 | HR 81 | Temp 97.7°F | Ht 71.0 in | Wt 208.6 lb

## 2021-05-29 DIAGNOSIS — J209 Acute bronchitis, unspecified: Secondary | ICD-10-CM | POA: Insufficient documentation

## 2021-05-29 DIAGNOSIS — J0140 Acute pansinusitis, unspecified: Secondary | ICD-10-CM | POA: Insufficient documentation

## 2021-05-29 DIAGNOSIS — R052 Subacute cough: Secondary | ICD-10-CM | POA: Insufficient documentation

## 2021-05-29 MED ORDER — PREDNISONE 10 MG (48) PO TBPK: ORAL_TABLET | ORAL | 0 refills | Status: DC

## 2021-05-29 MED ORDER — CEFUROXIME AXETIL 500 MG PO TABS: 500.0000 mg | ORAL_TABLET | Freq: Two times a day (BID) | ORAL | 0 refills | Status: DC

## 2021-05-29 MED ORDER — BENZONATATE 200 MG PO CAPS: 200.0000 mg | ORAL_CAPSULE | Freq: Two times a day (BID) | ORAL | 0 refills | Status: DC | PRN

## 2021-05-29 NOTE — Progress Notes (Signed)
Acute Office Visit  Subjective:    Patient ID: Vanessa Zavala, female    DOB: [birth year], 25 y.o.   MRN: [id number]  Chief Complaint  Patient presents with   Sinus Problem    The patient states that she has been seen twice since symptoms started about three weeks ago. Went to urgent care twice. Was tested for COVID 19 and for flu. Both tests were negative. She was given a z-pack. Took the entire thing. Felt no better. Went to ER after that. She had developed some chest pain. Chest x-ray was negative for acute cardiopulmonary disease. Was told she likely had rhinovirus and bronchitis. Was given a six day course of steroids and a rescue inhaler. She states that this is still not getting better. Cough is severe. Ears hurt. Every time she talks or chews or coughs she can feel her ears pop.   Sinus Problem This is a new problem. The current episode started 1 to 4 weeks ago. The problem is unchanged. There has been no fever. Associated symptoms include congestion, coughing, ear pain, headaches, a hoarse voice, shortness of breath, sinus pressure and a sore throat. Pertinent negatives include no chills or sneezing. Past treatments include oral decongestants. The treatment provided no relief.    Past Medical History:  Diagnosis Date   Abdominal pain    Goiter    Hashimoto's disease    Hypothyroidism, acquired, autoimmune    Oligomenorrhea    PCOS (polycystic ovarian syndrome)    Thyroiditis, autoimmune     Past Surgical History:  Procedure Laterality Date   NO PAST SURGERIES      Family History  Problem Relation Age of Onset   Thyroid disease Mother    Diabetes Maternal Grandmother    Cancer Maternal Grandmother    Thyroid disease Maternal Grandmother     Social History   Socioeconomic History   Marital status: Single    Spouse name: Not on file   Number of children: Not on file   Years of education: Not on file   Highest education level: Not on file  Occupational History    Not on file  Tobacco Use   Smoking status: Former    Types: E-cigarettes   Smokeless tobacco: Never  Vaping Use   Vaping Use: Every day  Substance and Sexual Activity   Alcohol use: Not Currently    Comment: occasionally   Drug use: Not Currently    Types: Marijuana   Sexual activity: Not Currently  Other Topics Concern   Not on file  Social History Narrative   Not on file   Social Determinants of Health   Financial Resource Strain: Not on file  Food Insecurity: Not on file  Transportation Needs: Not on file  Physical Activity: Not on file  Stress: Not on file  Social Connections: Not on file  Intimate Partner Violence: Not on file    Outpatient Medications Prior to Visit  Medication Sig Dispense Refill   amphetamine-dextroamphetamine (ADDERALL) 10 MG tablet Take 1 tablet (10 mg total) by mouth 2 (two) times daily as needed. 60 tablet 0   norelgestromin-ethinyl estradiol (ORTHO EVRA) 150-35 MCG/24HR transdermal patch Place 1 patch onto the skin once a week. 3 patch 12   No facility-administered medications prior to visit.    No Known Allergies  Review of Systems  Constitutional:  Positive for fatigue. Negative for activity change, appetite change, chills and fever.  HENT:  Positive for congestion, ear pain, hoarse voice, postnasal  drip, rhinorrhea, sinus pressure, sore throat and trouble swallowing. Negative for sinus pain and sneezing.   Eyes: Negative.   Respiratory:  Positive for cough and shortness of breath. Negative for chest tightness and wheezing.   Cardiovascular:  Negative for chest pain and palpitations.  Gastrointestinal:  Negative for abdominal pain, constipation, diarrhea, nausea and vomiting.  Endocrine: Negative for cold intolerance, heat intolerance, polydipsia and polyuria.  Genitourinary:  Negative for dyspareunia, dysuria, flank pain, frequency and urgency.  Musculoskeletal:  Negative for arthralgias, back pain and myalgias.  Skin:  Negative for  rash.  Allergic/Immunologic: Negative for environmental allergies.  Neurological:  Positive for headaches. Negative for dizziness and weakness.  Hematological:  Negative for adenopathy.  Psychiatric/Behavioral:  The patient is not nervous/anxious.       Objective:    Physical Exam Vitals and nursing note reviewed.  Constitutional:      Appearance: Normal appearance. She is well-developed. She is ill-appearing.  HENT:     Head: Normocephalic and atraumatic.     Right Ear: Tympanic membrane is erythematous.     Left Ear: Tympanic membrane is erythematous.     Nose: Congestion and rhinorrhea present.     Right Sinus: Frontal sinus tenderness present.     Left Sinus: Frontal sinus tenderness present.     Mouth/Throat:     Mouth: Mucous membranes are moist.     Pharynx: Posterior oropharyngeal erythema present.     Comments: Generalized oropharyngeal inflammation.  Eyes:     Pupils: Pupils are equal, round, and reactive to light.  Cardiovascular:     Rate and Rhythm: Normal rate and regular rhythm.     Pulses: Normal pulses.     Heart sounds: Normal heart sounds.  Pulmonary:     Effort: Pulmonary effort is normal.     Breath sounds: Normal breath sounds.     Comments: Patient has dry, harsh cough noted  Abdominal:     Palpations: Abdomen is soft.  Musculoskeletal:        General: Normal range of motion.     Cervical back: Normal range of motion and neck supple.  Lymphadenopathy:     Cervical: No cervical adenopathy.  Skin:    General: Skin is warm and dry.     Capillary Refill: Capillary refill takes less than 2 seconds.  Neurological:     General: No focal deficit present.     Mental Status: She is alert and oriented to person, place, and time.  Psychiatric:        Mood and Affect: Mood normal.        Behavior: Behavior normal.        Thought Content: Thought content normal.        Judgment: Judgment normal.    Today's Vitals   05/29/21 1319  BP: 121/77  Pulse: 81   Temp: 97.7 F (36.5 C)  SpO2: 98%  Weight: 208 lb 9.6 oz (94.6 kg)   Body mass index is 29.09 kg/m.   Wt Readings from Last 3 Encounters:  05/29/21 208 lb 9.6 oz (94.6 kg)  04/26/21 208 lb 9.6 oz (94.6 kg)  03/27/21 208 lb 9.6 oz (94.6 kg)    Health Maintenance Due  Topic Date Due   COVID-19 Vaccine (1) Never done   HPV VACCINES (1 - 2-dose series) Never done   Hepatitis C Screening  Never done       Topic Date Due   HPV VACCINES (1 - 2-dose series) Never done  Lab Results  Component Value Date   TSH 0.80 07/28/2014   Lab Results  Component Value Date   WBC 21.9 (H) 09/19/2020   HGB 11.1 (L) 09/19/2020   HCT 31.5 (L) 09/19/2020   MCV 89.7 09/19/2020   PLT 215 09/19/2020   Lab Results  Component Value Date   NA 138 09/18/2020   K 3.7 09/18/2020   CO2 21 (L) 09/18/2020   GLUCOSE 99 09/18/2020   BUN 9 09/18/2020   CREATININE 0.63 09/18/2020   BILITOT 0.6 09/18/2020   ALKPHOS 119 09/18/2020   AST 20 09/18/2020   ALT 16 09/18/2020   PROT 6.6 09/18/2020   ALBUMIN 3.1 (L) 09/18/2020   CALCIUM 9.6 09/18/2020   ANIONGAP 7 09/18/2020   No results found for: CHOL No results found for: HDL No results found for: LDLCALC No results found for: TRIG No results found for: CHOLHDL No results found for: ZCHY8F     Assessment & Plan:  1. Acute non-recurrent pansinusitis Start ceftin 500mg  twice daily for 7 days. Rest and increase fluids. Continue using OTC medication to control symptoms.  Add prednisone taper. Take as directed for 12 days. Encouraged her to contact the office if symptoms are not improving or if they worsen over next five days.  - predniSONE (STERAPRED UNI-PAK 48 TAB) 10 MG (48) TBPK tablet; 12 day taper - take by mouth as directed for 12 days  Dispense: 48 tablet; Refill: 0 - cefUROXime (CEFTIN) 500 MG tablet; Take 1 tablet (500 mg total) by mouth 2 (two) times daily with a meal.  Dispense: 14 tablet; Refill: 0  2. Acute bronchitis, unspecified  organism Continue to use previously prescribed rescue inhaler as needed for cough and shortness of breath. Prednisone taper started.   3. Subacute cough Tessalon perls may be taken up to three times daily as needed for cough. - predniSONE (STERAPRED UNI-PAK 48 TAB) 10 MG (48) TBPK tablet; 12 day taper - take by mouth as directed for 12 days  Dispense: 48 tablet; Refill: 0 - benzonatate (TESSALON) 200 MG capsule; Take 1 capsule (200 mg total) by mouth 2 (two) times daily as needed for cough.  Dispense: 20 capsule; Refill: 0    Problem List Items Addressed This Visit       Respiratory   Acute non-recurrent pansinusitis - Primary   Relevant Medications   predniSONE (STERAPRED UNI-PAK 48 TAB) 10 MG (48) TBPK tablet   cefUROXime (CEFTIN) 500 MG tablet   benzonatate (TESSALON) 200 MG capsule   Acute bronchitis     Other   Subacute cough   Relevant Medications   predniSONE (STERAPRED UNI-PAK 48 TAB) 10 MG (48) TBPK tablet   benzonatate (TESSALON) 200 MG capsule     Meds ordered this encounter  Medications   predniSONE (STERAPRED UNI-PAK 48 TAB) 10 MG (48) TBPK tablet    Sig: 12 day taper - take by mouth as directed for 12 days    Dispense:  48 tablet    Refill:  0    Order Specific Question:   Supervising Provider    Answer:   Nani Gasser D [[id number]]   cefUROXime (CEFTIN) 500 MG tablet    Sig: Take 1 tablet (500 mg total) by mouth 2 (two) times daily with a meal.    Dispense:  14 tablet    Refill:  0    Order Specific Question:   Supervising Provider    Answer:   Nani Gasser D [[id number]]   benzonatate (TESSALON)  200 MG capsule    Sig: Take 1 capsule (200 mg total) by mouth 2 (two) times daily as needed for cough.    Dispense:  20 capsule    Refill:  0    Order Specific Question:   Supervising Provider    Answer:   Nani Gasser D [[id number]]     Carlean Jews, NP

## 2021-07-16 IMAGING — DX DG CHEST 1V PORT
1 series · 1 of 1 positions shown · non-contrast
Comparison: 04/27/2016

CLINICAL DATA: Chest pain

EXAM:
PORTABLE CHEST 1 VIEW

[chest ap]
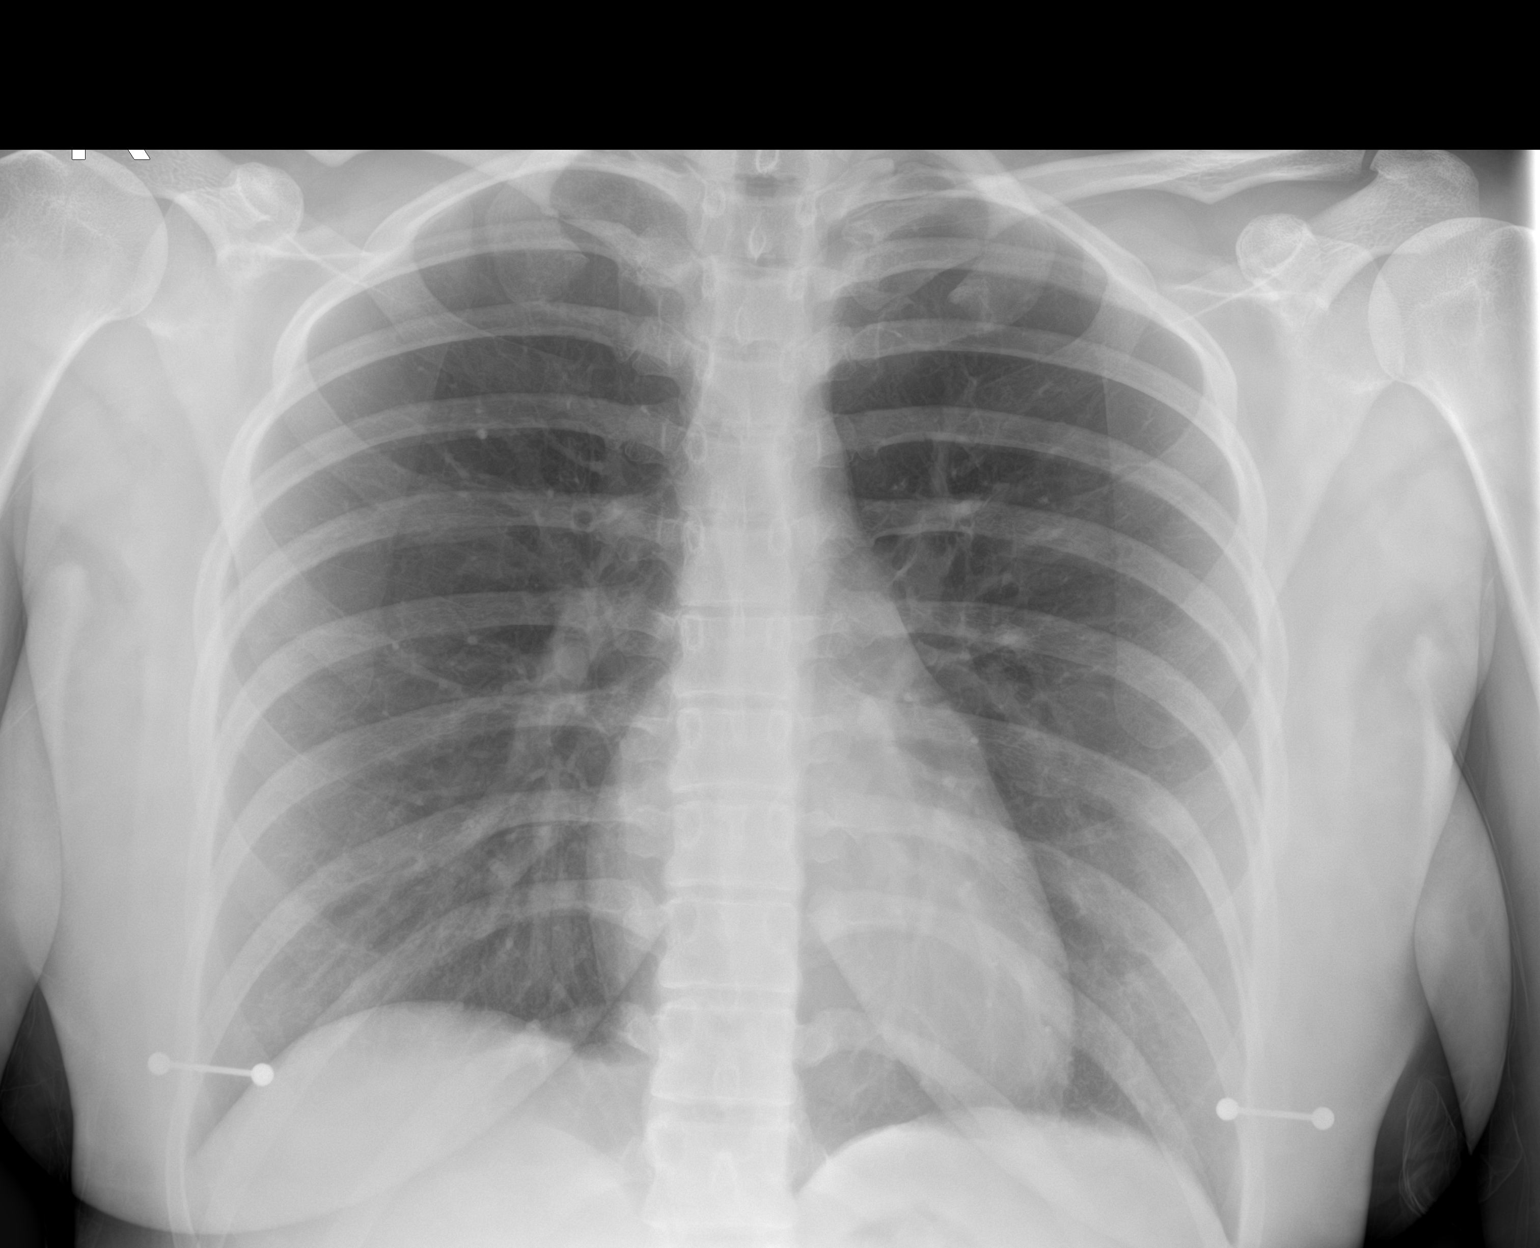

[1 of 1 positions shown; findings below may reference images not displayed]

FINDINGS: Single frontal view of the chest demonstrates an unremarkable
cardiac silhouette. No airspace disease, effusion, or pneumothorax.
No acute bony abnormalities.
IMPRESSION: 1. No acute intrathoracic process.

## 2021-07-24 ENCOUNTER — Encounter: Payer: Self-pay | Admitting: Nurse Practitioner

## 2021-07-24 ENCOUNTER — Other Ambulatory Visit: Payer: Self-pay

## 2021-07-24 ENCOUNTER — Ambulatory Visit (INDEPENDENT_AMBULATORY_CARE_PROVIDER_SITE_OTHER): Payer: Medicaid Other | Admitting: Nurse Practitioner

## 2021-07-24 VITALS — BP 109/68 | HR 71 | Temp 97.9°F | Ht 71.0 in | Wt 186.0 lb

## 2021-07-24 DIAGNOSIS — F988 Other specified behavioral and emotional disorders with onset usually occurring in childhood and adolescence: Secondary | ICD-10-CM | POA: Diagnosis not present

## 2021-07-24 DIAGNOSIS — E039 Hypothyroidism, unspecified: Secondary | ICD-10-CM | POA: Diagnosis not present

## 2021-07-24 DIAGNOSIS — E663 Overweight: Secondary | ICD-10-CM | POA: Diagnosis not present

## 2021-07-24 MED ORDER — AMPHETAMINE-DEXTROAMPHETAMINE 10 MG PO TABS: 10.0000 mg | ORAL_TABLET | Freq: Two times a day (BID) | ORAL | 0 refills | Status: DC | PRN

## 2021-07-24 NOTE — Progress Notes (Signed)
Established Patient Office Visit  Subjective:  Patient ID: Vanessa Zavala, female    DOB: [birth year]  Age: 26 y.o. MRN: [id number]  CC:  Chief Complaint  Patient presents with   ADD    Refill medicatoin.   Hypothyroidism    HPI [name] presents for medication follow up. She has been taking Adderall to help with focus and concentration. She is currently on 10mg  up to twice daily. She states that this has helped her so much. 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 She states that adderall helps keep her on track through long days without negative side effects.  Has history of levothyroxine. At one point, during her pregnancy, she was on levothyroxine daily. Endocrinologist was still trying to get this dosing right. Providerrecently cancelled on her. She has been without medication for some time. Would like to check levels and get back on normalizing dose of levothyroxine if necessary.  The patient states that she is currently doing Keto diet. Has lost 22 pounds since she was last seen in November.   Past Medical History:  Diagnosis Date   Abdominal pain    Goiter    Hashimoto's disease    Hypothyroidism, acquired, autoimmune    Oligomenorrhea    PCOS (polycystic ovarian syndrome)    Thyroiditis, autoimmune     Past Surgical History:  Procedure Laterality Date   NO PAST SURGERIES      Family History  Problem Relation Age of Onset   Thyroid disease Mother    Diabetes Maternal Grandmother    Cancer Maternal Grandmother    Thyroid disease Maternal Grandmother     Social History   Socioeconomic History   Marital status: Single    Spouse name: Not on file   Number of children: Not on file   Years of education: Not on file   Highest education level: Not on file  Occupational History   Not on file  Tobacco Use   Smoking status: Former    Types: E-cigarettes   Smokeless tobacco: Never  Vaping Use    Vaping Use: Every day  Substance and Sexual Activity   Alcohol use: Not Currently    Comment: occasionally   Drug use: Not Currently    Types: Marijuana   Sexual activity: Not Currently  Other Topics Concern   Not on file  Social History Narrative   Not on file   Social Determinants of Health   Financial Resource Strain: Not on file  Food Insecurity: Not on file  Transportation Needs: Not on file  Physical Activity: Not on file  Stress: Not on file  Social Connections: Not on file  Intimate Partner Violence: Not on file    Outpatient Medications Prior to Visit  Medication Sig Dispense Refill   amphetamine-dextroamphetamine (ADDERALL) 10 MG tablet Take 1 tablet (10 mg total) by mouth 2 (two) times daily as needed. 60 tablet 0   benzonatate (TESSALON) 200 MG capsule Take 1 capsule (200 mg total) by mouth 2 (two) times daily as needed for cough. 20 capsule 0   cefUROXime (CEFTIN) 500 MG tablet Take 1 tablet (500 mg total) by mouth 2 (two) times daily with a meal. 14 tablet 0   norelgestromin-ethinyl estradiol (ORTHO EVRA) 150-35 MCG/24HR transdermal patch Place 1 patch onto the skin once a week. 3 patch 12   predniSONE (STERAPRED UNI-PAK 48 TAB) 10 MG (48) TBPK tablet 12 day taper -  take by mouth as directed for 12 days 48 tablet 0   No facility-administered medications prior to visit.    No Known Allergies  ROS Review of Systems  Constitutional:  Negative for activity change, appetite change, chills, fatigue and fever.       22 pounds since most recent visit - has been following Keto diet   HENT:  Negative for congestion, postnasal drip, rhinorrhea, sinus pressure, sinus pain, sneezing and sore throat.   Eyes: Negative.   Respiratory:  Negative for cough, chest tightness, shortness of breath and wheezing.   Cardiovascular:  Negative for chest pain and palpitations.  Gastrointestinal:  Negative for abdominal pain, constipation, diarrhea, nausea and vomiting.  Endocrine:  Negative for cold intolerance, heat intolerance, polydipsia and polyuria.       History of hypothyroid  Genitourinary:  Negative for dyspareunia, dysuria, flank pain, frequency and urgency.  Musculoskeletal:  Negative for arthralgias, back pain and myalgias.  Skin:  Negative for rash.  Allergic/Immunologic: Negative for environmental allergies.  Neurological:  Negative for dizziness, weakness and headaches.  Hematological:  Negative for adenopathy.  Psychiatric/Behavioral:  Positive for decreased concentration. The patient is not nervous/anxious.        Doing better with adderall 10mg  twice daily.      Objective:    Physical Exam Vitals and nursing note reviewed.  Constitutional:      Appearance: Normal appearance. She is well-developed.  HENT:     Head: Normocephalic and atraumatic.     Nose: Nose normal.     Mouth/Throat:     Mouth: Mucous membranes are moist.  Eyes:     Extraocular Movements: Extraocular movements intact.     Conjunctiva/sclera: Conjunctivae normal.     Pupils: Pupils are equal, round, and reactive to light.  Cardiovascular:     Rate and Rhythm: Normal rate and regular rhythm.     Pulses: Normal pulses.     Heart sounds: Normal heart sounds.  Pulmonary:     Effort: Pulmonary effort is normal.     Breath sounds: Normal breath sounds.  Abdominal:     Palpations: Abdomen is soft.  Musculoskeletal:        General: Normal range of motion.     Cervical back: Normal range of motion and neck supple.  Lymphadenopathy:     Cervical: No cervical adenopathy.  Skin:    General: Skin is warm and dry.     Capillary Refill: Capillary refill takes less than 2 seconds.  Neurological:     General: No focal deficit present.     Mental Status: She is alert and oriented to person, place, and time.  Psychiatric:        Mood and Affect: Mood normal.        Behavior: Behavior normal.        Thought Content: Thought content normal.        Judgment: Judgment normal.     Today's Vitals   07/24/21 1338  BP: 109/68  Pulse: 71  Temp: 97.9 F (36.6 C)  SpO2: 96%  Weight: 186 lb (84.4 kg)  Height: 5\' 11"  (1.803 m)   Body mass index is 25.94 kg/m.   Wt Readings from Last 3 Encounters:  07/24/21 186 lb (84.4 kg)  05/29/21 208 lb 9.6 oz (94.6 kg)  04/26/21 208 lb 9.6 oz (94.6 kg)     Health Maintenance Due  Topic Date Due   COVID-19 Vaccine (1) Never done   HPV VACCINES (1 - 2-dose  series) Never done   Hepatitis C Screening  Never done       Topic Date Due   HPV VACCINES (1 - 2-dose series) Never done    Lab Results  Component Value Date   TSH 18.200 (H) 07/24/2021   Lab Results  Component Value Date   WBC 21.9 (H) 09/19/2020   HGB 11.1 (L) 09/19/2020   HCT 31.5 (L) 09/19/2020   MCV 89.7 09/19/2020   PLT 215 09/19/2020   Lab Results  Component Value Date   NA 138 09/18/2020   K 3.7 09/18/2020   CO2 21 (L) 09/18/2020   GLUCOSE 99 09/18/2020   BUN 9 09/18/2020   CREATININE 0.63 09/18/2020   BILITOT 0.6 09/18/2020   ALKPHOS 119 09/18/2020   AST 20 09/18/2020   ALT 16 09/18/2020   PROT 6.6 09/18/2020   ALBUMIN 3.1 (L) 09/18/2020   CALCIUM 9.6 09/18/2020   ANIONGAP 7 09/18/2020     Assessment & Plan:  1. Acquired hypothyroidism Check thyroid panel and treat with levothyroxine as indicated  - TSH; Future - T4, free; Future - TSH - T4, free  2. Attention deficit disorder (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 Patient doing very well on current dose adderall. She may continue to take 10mg  up to twice daily as needed. Three 30 day prescriptions were sent to her pharmacy. Dates are 07/24/2021, 08/22/2021, and 09/17/2021. - amphetamine-dextroamphetamine (ADDERALL) 10 MG tablet; Take 1 tablet (10 mg total) by mouth 2 (two) times daily as needed.  Dispense: 60 tablet; Refill: 0  3. Overweight with body mass index (BMI) 25.0-29.9 Discussed lowering calorie intake to 1500 calories per day and incorporating exercise into daily routine to help lose  weight. Patient is currently following Keto diet and has already lost 22 pounds since November. Will continue ot monitor.     Problem List Items Addressed This Visit       Endocrine   Hypothyroidism - Primary   Relevant Orders   TSH (Completed)   T4, free (Completed)     Other   Attention deficit disorder (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   Relevant Medications   amphetamine-dextroamphetamine (ADDERALL) 10 MG tablet   Overweight with body mass index (BMI) 25.0-29.9    Meds ordered this encounter  Medications   DISCONTD: amphetamine-dextroamphetamine (ADDERALL) 10 MG tablet    Sig: Take 1 tablet (10 mg total) by mouth 2 (two) times daily as needed.    Dispense:  60 tablet    Refill:  0    Order Specific Question:   Supervising Provider    Answer:   05-10-2001 D [[id number]]   DISCONTD: amphetamine-dextroamphetamine (ADDERALL) 10 MG tablet    Sig: Take 1 tablet (10 mg total) by mouth 2 (two) times daily as needed.    Dispense:  60 tablet    Refill:  0    Fill after 08/22/2021    Order Specific Question:   Supervising Provider    Answer:   08/24/2021 D [[id number]]   amphetamine-dextroamphetamine (ADDERALL) 10 MG tablet    Sig: Take 1 tablet (10 mg total) by mouth 2 (two) times daily as needed.    Dispense:  60 tablet    Refill:  0    Fill after 09/17/2021    Order Specific Question:   Supervising Provider    Answer:   09/19/2021 D [[id number]]    Follow-up: Return in about 3 months (around 10/22/2021) for ADD.    10/24/2021, NP

## 2021-07-25 ENCOUNTER — Other Ambulatory Visit: Payer: Self-pay | Admitting: Nurse Practitioner

## 2021-07-25 DIAGNOSIS — E663 Overweight: Secondary | ICD-10-CM | POA: Insufficient documentation

## 2021-07-25 DIAGNOSIS — E039 Hypothyroidism, unspecified: Secondary | ICD-10-CM

## 2021-07-25 LAB — TSH: TSH: 18.2 u[IU]/mL — ABNORMAL HIGH (ref 0.450–4.500)

## 2021-07-25 LAB — T4, FREE: Free T4: 0.91 ng/dL (ref 0.82–1.77)

## 2021-07-25 MED ORDER — LEVOTHYROXINE SODIUM 50 MCG PO TABS: 50.0000 ug | ORAL_TABLET | Freq: Every day | ORAL | 1 refills | Status: DC

## 2021-07-25 NOTE — Progress Notes (Signed)
Please let the patient know that her labs are back. Indeed her thyroid is underactive. Her TSH was 18.200 with normal Free T4. I have added back levothyroxine daily. She should take this in the morning, prior to eating. We will recheck the thyroid panel at her next visit in three months. Thanks so much.   -HB

## 2021-10-21 NOTE — Progress Notes (Deleted)
Established patient visit ? ? ?Patient: Vanessa Zavala   DOB: 10/16/1995   26 y.o. Female  MRN: [id number] ?Visit Date: 10/22/2021 ? ? ?No chief complaint on file. ? ?Subjective  ?  ?HPI  ?Patient here for follow up medication.  ?-currently on adderall 10mg  twice daily as needed.  ?-review of PDMP profile shows overdose risk score 220 ?-most recent fill adderall 08/27/2021. ?-needs recheck tsh and free T4.  ? ? ?Medications: ?Outpatient Medications Prior to Visit  ?Medication Sig  ? amphetamine-dextroamphetamine (ADDERALL) 10 MG tablet Take 1 tablet (10 mg total) by mouth 2 (two) times daily as needed.  ? levothyroxine (SYNTHROID) 50 MCG tablet Take 1 tablet (50 mcg total) by mouth daily.  ? ?No facility-administered medications prior to visit.  ? ? ?Review of Systems ? ?{Labs (Optional):23779} ? ? Objective  ?  ?There were no vitals taken for this visit. ?BP Readings from Last 3 Encounters:  ?07/24/21 109/68  ?05/29/21 121/77  ?04/26/21 128/83  ?  ?Wt Readings from Last 3 Encounters:  ?07/24/21 186 lb (84.4 kg)  ?05/29/21 208 lb 9.6 oz (94.6 kg)  ?04/26/21 208 lb 9.6 oz (94.6 kg)  ?  ?Physical Exam  ?*** ? ?No results found for any visits on 10/22/21. ? Assessment & Plan  ?  ? ?Problem List Items Addressed This Visit   ?None ?  ? ?No follow-ups on file.  ?   ? ? ? ? ?10/24/21, NP  ?[facility] ?[phone] (phone) ?[phone] (fax) ? ?[facility]  ?

## 2021-10-22 ENCOUNTER — Ambulatory Visit: Payer: Medicaid Other | Admitting: Nurse Practitioner

## 2021-10-23 ENCOUNTER — Other Ambulatory Visit: Payer: Self-pay | Admitting: Nurse Practitioner

## 2021-10-23 ENCOUNTER — Telehealth: Payer: Self-pay | Admitting: Nurse Practitioner

## 2021-10-23 DIAGNOSIS — F988 Other specified behavioral and emotional disorders with onset usually occurring in childhood and adolescence: Secondary | ICD-10-CM

## 2021-10-23 MED ORDER — AMPHETAMINE-DEXTROAMPHETAMINE 10 MG PO TABS: 10.0000 mg | ORAL_TABLET | Freq: Two times a day (BID) | ORAL | 0 refills | Status: DC | PRN

## 2021-10-23 NOTE — Telephone Encounter (Signed)
Patient missed her appointment yesterday and made one for next Friday 04/28 but wants to know if you can refill her Adderall? Please advise.  ?

## 2021-10-23 NOTE — Telephone Encounter (Signed)
Called pt she is advised of her Rx that was sent 

## 2021-10-23 NOTE — Telephone Encounter (Signed)
Renewed adderall for 30 days. She should keep her upcoming appointment for further refills.

## 2021-10-23 NOTE — Progress Notes (Signed)
Renewed adderall for 30 days. She has upcoming appointment for further refills.  ?

## 2021-10-24 NOTE — Progress Notes (Signed)
?Established patient visit ? ? ?Patient: Vanessa Zavala   DOB: 1996-04-11   26 y.o. Female  MRN: [id number] ?Visit Date: 10/25/2021 ? ? ?No chief complaint on file. ? ?Subjective  ?  ?HPI  ?The patient is here for acute visit.  ?-noted hemorrhoid on Thursday. She states that it is worse than other hemorrhoids she has had in the past.  ?-started getting bigger. She could feel it hanging out. Can feel something there when she is sitting up. Causes pressure when she is standing. Started using preporation H and then hemorrhoid started bleeding. Has been bleeding small amounts of blood pretty consistently since she started using preperation H. ?-she denies constipation leading up to the appearance of the hemorrhoid. States that she is having some constipation now because she is feeling worried about bowel movements and that having a bowel movement will hurt.  ? ? ? ?Medications: ?Outpatient Medications Prior to Visit  ?Medication Sig  ? amphetamine-dextroamphetamine (ADDERALL) 10 MG tablet Take 1 tablet (10 mg total) by mouth 2 (two) times daily as needed.  ? levothyroxine (SYNTHROID) 50 MCG tablet Take 1 tablet (50 mcg total) by mouth daily.  ? ?No facility-administered medications prior to visit.  ?  ?Review of Systems  ?Constitutional:  Negative for activity change, appetite change, chills, fatigue and fever.  ?HENT:  Negative for congestion, postnasal drip, rhinorrhea, sinus pressure, sinus pain, sneezing and sore throat.   ?Eyes: Negative.   ?Respiratory:  Negative for cough, chest tightness, shortness of breath and wheezing.   ?Cardiovascular:  Negative for chest pain and palpitations.  ?Gastrointestinal:  Negative for abdominal pain, constipation, diarrhea, nausea and vomiting.  ?Endocrine: Negative for cold intolerance, heat intolerance, polydipsia and polyuria.  ?Genitourinary:  Negative for dyspareunia, dysuria, flank pain, frequency and urgency.  ?     Severe hemorrhoid which is painful, itching, and bleeding   ?Musculoskeletal:  Negative for arthralgias, back pain and myalgias.  ?Skin:  Negative for rash.  ?Allergic/Immunologic: Negative for environmental allergies.  ?Neurological:  Negative for dizziness, weakness and headaches.  ?Hematological:  Negative for adenopathy.  ?Psychiatric/Behavioral:  The patient is not nervous/anxious.   ? ? ? ? Objective  ?  ? ?Today's Vitals  ? 10/25/21 0837  ?BP: 98/62  ?Pulse: 65  ?Temp: 97.7 ?F (36.5 ?C)  ?SpO2: 100%  ?Weight: 173 lb 1.6 oz (78.5 kg)  ?Height: 5\' 11"  (1.803 m)  ? ?Body mass index is 24.14 kg/m?.  ? ?BP Readings from Last 3 Encounters:  ?10/25/21 98/62  ?07/24/21 109/68  ?05/29/21 121/77  ?  ?Wt Readings from Last 3 Encounters:  ?10/25/21 173 lb 1.6 oz (78.5 kg)  ?07/24/21 186 lb (84.4 kg)  ?05/29/21 208 lb 9.6 oz (94.6 kg)  ?  ?Physical Exam ?Vitals and nursing note reviewed.  ?Constitutional:   ?   Appearance: Normal appearance. She is well-developed.  ?HENT:  ?   Head: Normocephalic.  ?Eyes:  ?   Pupils: Pupils are equal, round, and reactive to light.  ?Cardiovascular:  ?   Rate and Rhythm: Normal rate and regular rhythm.  ?   Pulses: Normal pulses.  ?   Heart sounds: Normal heart sounds.  ?Pulmonary:  ?   Effort: Pulmonary effort is normal.  ?   Breath sounds: Normal breath sounds.  ?Abdominal:  ?   Palpations: Abdomen is soft.  ?Genitourinary: ?   Rectum: Tenderness and external hemorrhoid present.  ?   Comments: Grade 4 hemorrhoid present. Tender to palpate. No evidence of  bleeding at this time. Currently not thrombosed.  ?Musculoskeletal:     ?   General: Normal range of motion.  ?   Cervical back: Normal range of motion and neck supple.  ?Lymphadenopathy:  ?   Cervical: No cervical adenopathy.  ?Skin: ?   General: Skin is warm and dry.  ?   Capillary Refill: Capillary refill takes less than 2 seconds.  ?Neurological:  ?   General: No focal deficit present.  ?   Mental Status: She is alert and oriented to person, place, and time.  ?Psychiatric:     ?   Mood and  Affect: Mood normal.     ?   Behavior: Behavior normal.     ?   Thought Content: Thought content normal.     ?   Judgment: Judgment normal.  ?  ? Assessment & Plan  ?  ?1. Grade IV hemorrhoids ?Trial pramoxine/hydrocortisone cream - apply twice daily ad as needed after bowel movements. Recommended use of stool softeners and even Miralax until hemorrhoid improves. Refer to general surgery for further evaluation.  ?- pramoxine-hydrocortisone (PROCTOCREAM-HC) 1-1 % rectal cream; Apply rectally twice daily and after bowel movements as needed  Dispense: 30 g; Refill: 1 ?- Ambulatory referral to General Surgery  ? ?Problem List Items Addressed This Visit   ? ?  ? Cardiovascular and Mediastinum  ? Grade IV hemorrhoids - Primary  ? Relevant Medications  ? pramoxine-hydrocortisone (PROCTOCREAM-HC) 1-1 % rectal cream  ? Other Relevant Orders  ? Ambulatory referral to General Surgery  ?  ? ?Return for as scheduled.  ?   ? ? ? ? ?Ronnell Freshwater, NP  ?[facility] ?[phone] (phone) ?[phone] (fax) ? ?[facility]  ?

## 2021-10-25 ENCOUNTER — Encounter: Payer: Self-pay | Admitting: Nurse Practitioner

## 2021-10-25 ENCOUNTER — Ambulatory Visit (INDEPENDENT_AMBULATORY_CARE_PROVIDER_SITE_OTHER): Payer: Medicaid Other | Admitting: Nurse Practitioner

## 2021-10-25 ENCOUNTER — Telehealth: Payer: Self-pay | Admitting: Nurse Practitioner

## 2021-10-25 ENCOUNTER — Other Ambulatory Visit: Payer: Self-pay | Admitting: Nurse Practitioner

## 2021-10-25 VITALS — BP 98/62 | HR 65 | Temp 97.7°F | Ht 71.0 in | Wt 173.1 lb

## 2021-10-25 DIAGNOSIS — K643 Fourth degree hemorrhoids: Secondary | ICD-10-CM | POA: Diagnosis not present

## 2021-10-25 DIAGNOSIS — F988 Other specified behavioral and emotional disorders with onset usually occurring in childhood and adolescence: Secondary | ICD-10-CM

## 2021-10-25 MED ORDER — HYDROCORTISONE ACE-PRAMOXINE 1-1 % EX CREA: TOPICAL_CREAM | CUTANEOUS | 1 refills | Status: DC

## 2021-10-25 NOTE — Telephone Encounter (Signed)
Ok thanks 

## 2021-10-25 NOTE — Telephone Encounter (Signed)
Please send the Adderall to [facility]; it was sent to [facility] and she does not use that pharmacy.  ?

## 2021-10-29 ENCOUNTER — Other Ambulatory Visit: Payer: Self-pay | Admitting: Nurse Practitioner

## 2021-10-29 ENCOUNTER — Telehealth: Payer: Self-pay | Admitting: Nurse Practitioner

## 2021-10-29 DIAGNOSIS — F988 Other specified behavioral and emotional disorders with onset usually occurring in childhood and adolescence: Secondary | ICD-10-CM

## 2021-10-29 MED ORDER — AMPHETAMINE-DEXTROAMPHETAMINE 10 MG PO TABS: 10.0000 mg | ORAL_TABLET | Freq: Two times a day (BID) | ORAL | 0 refills | Status: DC | PRN

## 2021-10-29 NOTE — Telephone Encounter (Signed)
Her prescription for Adderall got sent over to [facility] and she normally goes over to [facility] however [facility] does not have enough medication to fill the prescription and she is asking if you can please cancel the one at [facility] and send to [facility]? ?

## 2021-10-29 NOTE — Telephone Encounter (Signed)
Called pt she is advised of her Rx that was sent to [facility] 

## 2021-10-29 NOTE — Telephone Encounter (Signed)
Please let the patient know that I Resent adderall prescription to [facility]. I put a note to the pharmacy that I incorrectly sent [facility] to [facility]. Thanks.

## 2021-10-29 NOTE — Progress Notes (Signed)
Resent adderall prescription to [facility].  ?

## 2021-11-02 ENCOUNTER — Encounter: Payer: Self-pay | Admitting: Nurse Practitioner

## 2021-11-02 ENCOUNTER — Ambulatory Visit (INDEPENDENT_AMBULATORY_CARE_PROVIDER_SITE_OTHER): Payer: Medicaid Other | Admitting: Nurse Practitioner

## 2021-11-02 VITALS — BP 107/61 | HR 80 | Temp 97.5°F | Ht 70.0 in | Wt 170.6 lb

## 2021-11-02 DIAGNOSIS — F988 Other specified behavioral and emotional disorders with onset usually occurring in childhood and adolescence: Secondary | ICD-10-CM

## 2021-11-02 DIAGNOSIS — R102 Pelvic and perineal pain unspecified side: Secondary | ICD-10-CM | POA: Insufficient documentation

## 2021-11-02 DIAGNOSIS — K643 Fourth degree hemorrhoids: Secondary | ICD-10-CM | POA: Diagnosis not present

## 2021-11-02 MED ORDER — AMPHETAMINE-DEXTROAMPHETAMINE 10 MG PO TABS: 10.0000 mg | ORAL_TABLET | Freq: Two times a day (BID) | ORAL | 0 refills | Status: DC | PRN

## 2021-11-02 NOTE — Patient Instructions (Signed)
Living With Attention Deficit Hyperactivity Disorder If you have been diagnosed with attention deficit hyperactivity disorder (ADHD), you may be relieved that you now know why you have felt or behaved a certain way. Still, you may feel overwhelmed about the treatment ahead. You may also wonder how to get the support you need and how to deal with the condition day-to-day. With treatment and support, you can live with ADHD and manage your symptoms. How to manage lifestyle changes Managing stress Stress is your body's reaction to life changes and events, both good and bad. To cope with the stress of an ADHD diagnosis, it may help to: Learn more about ADHD. Exercise regularly. Even a short daily walk can lower stress levels. Participate in training or education programs (including social skills training classes) that teach you to deal with symptoms.  Medicines Your health care provider may suggest certain medicines if he or she feels that they will help to improve your condition. Stimulant medicines are usually prescribed to treat ADHD, and therapy may also be prescribed. It is important to: Avoid using alcohol and other substances that may prevent your medicines from working properly (may interact). Talk with your pharmacist or health care provider about all the medicines that you take, their possible side effects, and what medicines are safe to take together. Make it your goal to take part in all treatment decisions (shared decision-making). Ask about possible side effects of medicines that your health care provider recommends, and tell him or her how you feel about having those side effects. It is best if shared decision-making with your health care provider is part of your total treatment plan. Relationships To strengthen your relationships with family members while treating your condition, consider taking part in family therapy. You might also attend self-help groups alone or with a loved one. Be  honest about how your symptoms affect your relationships. Make an effort to communicate respectfully instead of fighting, and find ways to show others that you care. Psychotherapy may be useful in helping you cope with how ADHD affects your relationships. How to recognize changes in your condition The following signs may mean that your treatment is working well and your condition is improving: Consistently being on time for appointments. Being more organized at home and work. Other people noticing improvements in your behavior. Achieving goals that you set for yourself. Thinking more clearly. The following signs may mean that your treatment is not working very well: Feeling impatience or more confusion. Missing, forgetting, or being late for appointments. An increasing sense of disorganization and messiness. More difficulty in reaching goals that you set for yourself. Loved ones becoming angry or frustrated with you. Follow these instructions at home: Take over-the-counter and prescription medicines only as told by your health care provider. Check with your health care provider before taking any new medicines. Create structure and an organized atmosphere at home. For example: Make a list of tasks, then rank them from most important to least important. Work on one task at a time until your listed tasks are done. Make a daily schedule and follow it consistently every day. Use an appointment calendar, and check it 2 or 3 times a day to keep on track. Keep it with you when you leave the house. Create spaces where you keep certain things, and always put things back in their places after you use them. Keep all follow-up visits as told by your health care provider. This is important. Where to find support Talking to others    Keep emotion out of important discussions and speak in a calm, logical way. Listen closely and patiently to your loved ones. Try to understand their point of view, and try to  avoid getting defensive. Take responsibility for the consequences of your actions. Ask that others do not take your behaviors personally. Aim to solve problems as they come up, and express your feelings instead of bottling them up. Talk openly about what you need from your loved ones and how they can support you. Consider going to family therapy sessions or having your family meet with a specialist who deals with ADHD-related behavior problems. Finances Not all insurance plans cover mental health care, so it is important to check with your insurance carrier. If paying for co-pays or counseling services is a problem, search for a local or county mental health care center. Public mental health care services may be offered there at a low cost or no cost when you are not able to see a private health care provider. If you are taking medicine for ADHD, you may be able to get the generic form, which may be less expensive than brand-name medicine. Some makers of prescription medicines also offer help to patients who cannot afford the medicines that they need. Questions to ask your health care provider: What are the risks and benefits of taking medicines? Would I benefit from therapy? How often should I follow up with a health care provider? Contact a health care provider if: You have side effects from your medicines, such as: Repeated muscle twitches, coughing, or speech outbursts. Sleep problems. Loss of appetite. Depression. New or worsening behavior problems. Dizziness. Unusually fast heartbeat. Stomach pains. Headaches. Get help right away if: You have a severe reaction to a medicine. Your behavior suddenly gets worse. Summary With treatment and support, you can live with ADHD and manage your symptoms. The medicines that are most often prescribed for ADHD are stimulants. Consider taking part in family therapy or self-help groups with family members or friends. When you talk with friends  and family about your ADHD, be patient and communicate openly. Take over-the-counter and prescription medicines only as told by your health care provider. Check with your health care provider before taking any new medicines. This information is not intended to replace advice given to you by your health care provider. Make sure you discuss any questions you have with your health care provider. Document Revised: 11/05/2019 Document Reviewed: 12/08/2019 Elsevier Patient Education  2023 Elsevier Inc.  

## 2021-11-02 NOTE — Progress Notes (Signed)
Established patient visit ? ? ?Patient: Vanessa Zavala   DOB: [birth year]   25 y.o. Female  MRN: [id number] ?Visit Date: 11/02/2021 ? ? ?Chief Complaint  ?Patient presents with  ? ADHD  ? ?Subjective  ?  ?HPI  ?Follow up medication ?-currently takes adderall 10mg  twice daily when needed. Continues to keep her focused and on track while at work. Has made a positive improvement on her work and home life.  ?Review PDMP profile -  ?--overdose risk score is 120 ?--last fill for adderall done 10/25/2021.  ?Last seen for moderate external hemorrhoid. Sore.  ?-states that this has improved significantly and she feels much better.  ? ? ?Medications: ?Outpatient Medications Prior to Visit  ?Medication Sig  ? levothyroxine (SYNTHROID) 50 MCG tablet Take 1 tablet (50 mcg total) by mouth daily.  ? pramoxine-hydrocortisone (PROCTOCREAM-HC) 1-1 % rectal cream Apply rectally twice daily and after bowel movements as needed  ? [DISCONTINUED] amphetamine-dextroamphetamine (ADDERALL) 10 MG tablet Take 1 tablet (10 mg total) by mouth 2 (two) times daily as needed.  ? ?No facility-administered medications prior to visit.  ? ? ?Review of Systems  ?Constitutional:  Negative for activity change, appetite change, chills, fatigue and fever.  ?HENT:  Negative for congestion, postnasal drip, rhinorrhea, sinus pressure, sinus pain, sneezing and sore throat.   ?Eyes: Negative.   ?Respiratory:  Negative for cough, chest tightness, shortness of breath and wheezing.   ?Cardiovascular:  Negative for chest pain and palpitations.  ?Gastrointestinal:  Negative for abdominal pain, constipation, diarrhea, nausea and vomiting.  ?Endocrine: Negative for cold intolerance, heat intolerance, polydipsia and polyuria.  ?Genitourinary:  Negative for dyspareunia, dysuria, flank pain, frequency and urgency.  ?Musculoskeletal:  Negative for arthralgias, back pain and myalgias.  ?Skin:  Negative for rash.  ?Allergic/Immunologic: Negative for environmental allergies.   ?Neurological:  Negative for dizziness, weakness and headaches.  ?Hematological:  Negative for adenopathy.  ?Psychiatric/Behavioral:  Positive for decreased concentration. The patient is not nervous/anxious.   ?     Doing very well with adderall 10 mg twice daily when needed.   ? ? ? ? Objective  ?  ? ?Today's Vitals  ? 11/02/21 0923  ?BP: 107/61  ?Pulse: 80  ?Temp: (!) 97.5 ?F (36.4 ?C)  ?SpO2: 98%  ?Weight: 170 lb 9.6 oz (77.4 kg)  ?Height: 5\' 10"  (1.778 m)  ? ?Body mass index is 24.48 kg/m?.  ? ?BP Readings from Last 3 Encounters:  ?11/02/21 107/61  ?10/25/21 98/62  ?07/24/21 109/68  ?  ?Wt Readings from Last 3 Encounters:  ?11/02/21 170 lb 9.6 oz (77.4 kg)  ?10/25/21 173 lb 1.6 oz (78.5 kg)  ?07/24/21 186 lb (84.4 kg)  ?  ?Physical Exam ?Vitals and nursing note reviewed.  ?Constitutional:   ?   Appearance: Normal appearance. She is well-developed.  ?HENT:  ?   Head: Normocephalic and atraumatic.  ?Eyes:  ?   Pupils: Pupils are equal, round, and reactive to light.  ?Cardiovascular:  ?   Rate and Rhythm: Normal rate and regular rhythm.  ?   Pulses: Normal pulses.  ?   Heart sounds: Normal heart sounds.  ?Pulmonary:  ?   Effort: Pulmonary effort is normal.  ?   Breath sounds: Normal breath sounds.  ?Abdominal:  ?   Palpations: Abdomen is soft.  ?Musculoskeletal:     ?   General: Normal range of motion.  ?   Cervical back: Normal range of motion and neck supple.  ?Lymphadenopathy:  ?  Cervical: No cervical adenopathy.  ?Skin: ?   General: Skin is warm and dry.  ?   Capillary Refill: Capillary refill takes less than 2 seconds.  ?Neurological:  ?   General: No focal deficit present.  ?   Mental Status: She is alert and oriented to person, place, and time.  ?Psychiatric:     ?   Mood and Affect: Mood normal.     ?   Behavior: Behavior normal.     ?   Thought Content: Thought content normal.     ?   Judgment: Judgment normal.  ?  ? ? Assessment & Plan  ?  ?1. Attention deficit disorder (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 ?Patient doing  very well with current dose of adderall. May continue to take 10mg  twice daily when needed. Three, post-dated prescriptions were sent to her pharmacy. Dates are 11/26/2021, 12/25/2021, and 01/22/2022.  ?- amphetamine-dextroamphetamine (ADDERALL) 10 MG tablet; Take 1 tablet (10 mg total) by mouth 2 (two) times daily as needed.  Dispense: 60 tablet; Refill: 0 ? ?2. Grade IV hemorrhoids ?Per patient, resolved.  ?  ?Problem List Items Addressed This Visit   ? ?  ? Cardiovascular and Mediastinum  ? Grade IV hemorrhoids  ?  ? Other  ? Attention deficit disorder (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 - Primary  ? Relevant Medications  ? amphetamine-dextroamphetamine (ADDERALL) 10 MG tablet  ?  ? ?Return in about 3 months (around 02/01/2022), or ok to be in early august, for ADD.  ?   ? ? ? ? ?September, NP  ?[facility] ?[phone] (phone) ?[phone] (fax) ? ?[facility]  ?

## 2021-12-19 DIAGNOSIS — R300 Dysuria: Secondary | ICD-10-CM | POA: Diagnosis not present

## 2021-12-19 DIAGNOSIS — N760 Acute vaginitis: Secondary | ICD-10-CM | POA: Diagnosis not present

## 2021-12-19 DIAGNOSIS — Z113 Encounter for screening for infections with a predominantly sexual mode of transmission: Secondary | ICD-10-CM | POA: Diagnosis not present

## 2022-01-25 ENCOUNTER — Ambulatory Visit (INDEPENDENT_AMBULATORY_CARE_PROVIDER_SITE_OTHER): Payer: Medicaid Other | Admitting: Nurse Practitioner

## 2022-01-25 ENCOUNTER — Encounter: Payer: Self-pay | Admitting: Nurse Practitioner

## 2022-01-25 VITALS — BP 103/67 | HR 76 | Ht 70.08 in | Wt 164.0 lb

## 2022-01-25 DIAGNOSIS — F988 Other specified behavioral and emotional disorders with onset usually occurring in childhood and adolescence: Secondary | ICD-10-CM

## 2022-01-25 DIAGNOSIS — L700 Acne vulgaris: Secondary | ICD-10-CM

## 2022-01-25 DIAGNOSIS — I7300 Raynaud's syndrome without gangrene: Secondary | ICD-10-CM

## 2022-01-25 DIAGNOSIS — E039 Hypothyroidism, unspecified: Secondary | ICD-10-CM | POA: Diagnosis not present

## 2022-01-25 MED ORDER — TRETINOIN 0.025 % EX CREA: TOPICAL_CREAM | Freq: Every day | CUTANEOUS | 2 refills | Status: DC

## 2022-01-25 MED ORDER — AMPHETAMINE-DEXTROAMPHETAMINE 10 MG PO TABS: 10.0000 mg | ORAL_TABLET | Freq: Two times a day (BID) | ORAL | 0 refills | Status: DC | PRN

## 2022-01-25 NOTE — Progress Notes (Signed)
Established patient visit   Patient: Vanessa Zavala   DOB: 11/19/1995   26 y.o. Female  MRN: [id number] Visit Date: 01/25/2022   Chief Complaint  Patient presents with   Follow-up   Subjective    HPI  Follow up visit  -hypothyroid --du to have check Tsh and Free T4 -ADD --currently oh adderall 10 mg twice daily as needed  --states that she is doing well with this dose  -has noted worsening issues with Raynaud's phenomenon. Hands and feel get so cold they are uncomfortable.  -has noted hypopigmentation of the skin of arms. Mostly wrists and uper arms.    Medications: Outpatient Medications Prior to Visit  Medication Sig   [DISCONTINUED] amphetamine-dextroamphetamine (ADDERALL) 10 MG tablet Take 1 tablet (10 mg total) by mouth 2 (two) times daily as needed.   [DISCONTINUED] levothyroxine (SYNTHROID) 50 MCG tablet Take 1 tablet (50 mcg total) by mouth daily.   [DISCONTINUED] pramoxine-hydrocortisone (PROCTOCREAM-HC) 1-1 % rectal cream Apply rectally twice daily and after bowel movements as needed   No facility-administered medications prior to visit.    Review of Systems  Constitutional:  Negative for activity change, appetite change, chills, fatigue and fever.  HENT:  Negative for congestion, postnasal drip, rhinorrhea, sinus pressure, sinus pain, sneezing and sore throat.   Eyes: Negative.   Respiratory:  Negative for cough, chest tightness, shortness of breath and wheezing.   Cardiovascular:  Negative for chest pain and palpitations.       Cold hands and cold feet, sometimes painful.  Gastrointestinal:  Negative for abdominal pain, constipation, diarrhea, nausea and vomiting.  Endocrine: Negative for cold intolerance, heat intolerance, polydipsia and polyuria.  Genitourinary:  Negative for dyspareunia, dysuria, flank pain, frequency and urgency.  Musculoskeletal:  Negative for arthralgias, back pain and myalgias.  Skin:  Negative for rash.  Allergic/Immunologic: Negative  for environmental allergies.  Neurological:  Negative for dizziness, weakness and headaches.  Hematological:  Negative for adenopathy.  Psychiatric/Behavioral:  Positive for decreased concentration. The patient is not nervous/anxious.        Improved concentration with current dose of Adderall taken on as-needed basis.    Last CBC Lab Results  Component Value Date   WBC 21.9 (H) 09/19/2020   HGB 11.1 (L) 09/19/2020   HCT 31.5 (L) 09/19/2020   MCV 89.7 09/19/2020   MCH 31.6 09/19/2020   RDW 14.3 09/19/2020   PLT 215 09/19/2020   Last metabolic panel Lab Results  Component Value Date   GLUCOSE 99 09/18/2020   NA 138 09/18/2020   K 3.7 09/18/2020   CL 110 09/18/2020   CO2 21 (L) 09/18/2020   BUN 9 09/18/2020   CREATININE 0.63 09/18/2020   GFRNONAA >60 09/18/2020   CALCIUM 9.6 09/18/2020   PROT 6.6 09/18/2020   ALBUMIN 3.1 (L) 09/18/2020   BILITOT 0.6 09/18/2020   ALKPHOS 119 09/18/2020   AST 20 09/18/2020   ALT 16 09/18/2020   ANIONGAP 7 09/18/2020   Last thyroid functions Lab Results  Component Value Date   TSH 40.400 (H) 01/25/2022        Objective     Today's Vitals   01/25/22 0957  BP: 103/67  Pulse: 76  SpO2: 99%  Weight: 164 lb (74.4 kg)  Height: 5' 10.08" (1.78 m)   Body mass index is 23.48 kg/m.   BP Readings from Last 3 Encounters:  01/25/22 103/67  11/02/21 107/61  10/25/21 98/62    Wt Readings from Last 3 Encounters:  01/25/22 164  lb (74.4 kg)  11/02/21 170 lb 9.6 oz (77.4 kg)  10/25/21 173 lb 1.6 oz (78.5 kg)    Physical Exam Vitals and nursing note reviewed.  Constitutional:      Appearance: Normal appearance. She is well-developed.  HENT:     Head: Normocephalic and atraumatic.     Nose: Nose normal.     Mouth/Throat:     Mouth: Mucous membranes are moist.     Pharynx: Oropharynx is clear.  Eyes:     Extraocular Movements: Extraocular movements intact.     Conjunctiva/sclera: Conjunctivae normal.     Pupils: Pupils are  equal, round, and reactive to light.  Cardiovascular:     Rate and Rhythm: Normal rate and regular rhythm.     Pulses: Normal pulses.     Heart sounds: Normal heart sounds.  Pulmonary:     Effort: Pulmonary effort is normal.     Breath sounds: Normal breath sounds.  Abdominal:     Palpations: Abdomen is soft.  Musculoskeletal:        General: Normal range of motion.     Cervical back: Normal range of motion and neck supple.  Lymphadenopathy:     Cervical: No cervical adenopathy.  Skin:    General: Skin is warm and dry.     Capillary Refill: Capillary refill takes less than 2 seconds.     Comments: Mild facial acne    Neurological:     General: No focal deficit present.     Mental Status: She is alert and oriented to person, place, and time.  Psychiatric:        Mood and Affect: Mood normal.        Behavior: Behavior normal.        Thought Content: Thought content normal.        Judgment: Judgment normal.     Results for orders placed or performed in visit on 01/25/22  TSH + free T4  Result Value Ref Range   TSH 40.400 (H) 0.450 - 4.500 uIU/mL   Free T4 0.91 0.82 - 1.77 ng/dL    Assessment & Plan    1. Acquired hypothyroidism Check thyroid panel today.  Adjust levothyroxine dosing as indicated. - TSH + free T4; Future - TSH + free T4  2. Acne vulgaris Trial tretinoin cream.  Apply to affected areas daily if needed. - tretinoin (RETIN-A) 0.025 % cream; Apply topically at bedtime.  Dispense: 45 g; Refill: 2  3. Attention deficit disorder (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 Stable.  May continue Adderall 10 mg (twice daily if needed. Three 30-day prescription sent to her pharmacy.  Dates are 01/25/2022, 02/23/2022, and 03/24/2022. - amphetamine-dextroamphetamine (ADDERALL) 10 MG tablet; Take 1 tablet (10 mg total) by mouth 2 (two) times daily as needed.  Dispense: 60 tablet; Refill: 0  4. Raynaud's phenomenon without gangrene Discussed symptom management such as temporary hand warmers and foot  warmers.  Also running hands under warm water.  Medications to help with symptoms not really an option at this time.  We will reassess at next visit.   Problem List Items Addressed This Visit       Endocrine   Hypothyroidism - Primary   Relevant Orders   TSH + free T4 (Completed)     Musculoskeletal and Integument   Acne vulgaris   Relevant Medications   tretinoin (RETIN-A) 0.025 % cream     Other   Attention deficit disorder (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   Relevant Medications   amphetamine-dextroamphetamine (  ADDERALL) 10 MG tablet   Raynaud's phenomenon without gangrene     Return in about 3 months (around 04/27/2022) for hypothyroid, ADD .         Carlean Jews, NP  [facility] [phone] (phone) [phone] (fax)  [facility]

## 2022-01-26 LAB — TSH+FREE T4
Free T4: 0.91 ng/dL (ref 0.82–1.77)
TSH: 40.4 u[IU]/mL — ABNORMAL HIGH (ref 0.450–4.500)

## 2022-01-29 ENCOUNTER — Other Ambulatory Visit: Payer: Self-pay

## 2022-01-29 DIAGNOSIS — E039 Hypothyroidism, unspecified: Secondary | ICD-10-CM

## 2022-01-29 MED ORDER — LEVOTHYROXINE SODIUM 50 MCG PO TABS: 50.0000 ug | ORAL_TABLET | Freq: Every day | ORAL | 1 refills | Status: DC

## 2022-02-01 ENCOUNTER — Ambulatory Visit: Payer: Medicaid Other | Admitting: Nurse Practitioner

## 2022-02-10 DIAGNOSIS — I7300 Raynaud's syndrome without gangrene: Secondary | ICD-10-CM | POA: Insufficient documentation

## 2022-02-10 DIAGNOSIS — L700 Acne vulgaris: Secondary | ICD-10-CM | POA: Insufficient documentation

## 2022-02-10 NOTE — Progress Notes (Signed)
Had she not been taking  the levothyroxine? I see that you sent her a new prescription for the 50 mcg dose.

## 2022-02-14 ENCOUNTER — Telehealth: Payer: Self-pay | Admitting: Nurse Practitioner

## 2022-02-14 ENCOUNTER — Other Ambulatory Visit: Payer: Self-pay | Admitting: Nurse Practitioner

## 2022-02-14 DIAGNOSIS — N390 Urinary tract infection, site not specified: Secondary | ICD-10-CM

## 2022-02-14 MED ORDER — NITROFURANTOIN MONOHYD MACRO 100 MG PO CAPS: 100.0000 mg | ORAL_CAPSULE | Freq: Two times a day (BID) | ORAL | 0 refills | Status: DC

## 2022-02-14 NOTE — Telephone Encounter (Signed)
Please let her know that I sent prescription for macrobid. This is twice daily for next 5 days. She should increase water intake and I recommend she take over the counter AZO as indicated on the box to help reduce bladder pain and spasms. Thanks so much.   -HB

## 2022-02-14 NOTE — Telephone Encounter (Signed)
Called pt she is advised of her Rx that was sent to pharmacy and recommendation  

## 2022-02-14 NOTE — Telephone Encounter (Signed)
Patient called and states she has a raging UTI and cannot get off of work to come in and is asking if you will please send her in an antibiotic.

## 2022-03-05 ENCOUNTER — Telehealth: Payer: Self-pay | Admitting: Nurse Practitioner

## 2022-03-05 NOTE — Telephone Encounter (Signed)
Hey. That last visit was in July. Because it is a change in dosing and there are post dated prescriptions at the pharmacy, I do need to see her before changes can be made.  Thanks  -HB

## 2022-03-05 NOTE — Telephone Encounter (Signed)
Patient stated that Herbert Seta and her had a conversation at last visit regarding increasing the dosage of Adderall and at the time the patient did not want to but she has changed her mind and thinks it's best if it's increase. Please advise.

## 2022-03-06 NOTE — Telephone Encounter (Signed)
Notified pt. 

## 2022-03-06 NOTE — Telephone Encounter (Signed)
Called pt LVM to call the office  

## 2022-03-13 ENCOUNTER — Ambulatory Visit: Payer: Medicaid Other | Admitting: Nurse Practitioner

## 2022-03-20 ENCOUNTER — Encounter: Payer: Self-pay | Admitting: Nurse Practitioner

## 2022-03-20 ENCOUNTER — Ambulatory Visit (INDEPENDENT_AMBULATORY_CARE_PROVIDER_SITE_OTHER): Payer: Medicaid Other | Admitting: Nurse Practitioner

## 2022-03-20 VITALS — BP 121/71 | HR 78 | Ht 70.08 in | Wt 160.1 lb

## 2022-03-20 DIAGNOSIS — F988 Other specified behavioral and emotional disorders with onset usually occurring in childhood and adolescence: Secondary | ICD-10-CM

## 2022-03-20 DIAGNOSIS — E039 Hypothyroidism, unspecified: Secondary | ICD-10-CM | POA: Diagnosis not present

## 2022-03-20 MED ORDER — AMPHETAMINE-DEXTROAMPHETAMINE 20 MG PO TABS: 20.0000 mg | ORAL_TABLET | Freq: Two times a day (BID) | ORAL | 0 refills | Status: DC | PRN

## 2022-03-20 NOTE — Progress Notes (Signed)
Established patient visit   Patient: Vanessa Zavala   DOB: 1995-08-05   26 y.o. Female  MRN: [id number] Visit Date: 03/20/2022   Chief Complaint  Patient presents with   Medication Refill   Subjective    HPI  The patient here for follow up  -adhd -now working muchlonger hours.  -worse 8:30 am to 2 pm then from 3 pm to 7:30 -single mom. Comes home from work and still has to take care of young son.  -finding that 10 mg adderall twice daily is not enough to keep her on track and focused.  -she denies negative side effects with current dose of adderall.     Medications: Outpatient Medications Prior to Visit  Medication Sig   levothyroxine (SYNTHROID) 50 MCG tablet Take 1 tablet (50 mcg total) by mouth daily.   nitrofurantoin, macrocrystal-monohydrate, (MACROBID) 100 MG capsule Take 1 capsule (100 mg total) by mouth 2 (two) times daily.   tretinoin (RETIN-A) 0.025 % cream Apply topically at bedtime.   [DISCONTINUED] amphetamine-dextroamphetamine (ADDERALL) 10 MG tablet Take 1 tablet (10 mg total) by mouth 2 (two) times daily as needed.   No facility-administered medications prior to visit.    Review of Systems  Constitutional:  Positive for fatigue. Negative for activity change, appetite change, chills and fever.  HENT:  Negative for congestion, postnasal drip, rhinorrhea, sinus pressure, sinus pain, sneezing and sore throat.   Eyes: Negative.   Respiratory:  Negative for cough, chest tightness, shortness of breath and wheezing.   Cardiovascular:  Negative for chest pain and palpitations.  Gastrointestinal:  Negative for abdominal pain, constipation, diarrhea, nausea and vomiting.  Endocrine: Negative for cold intolerance, heat intolerance, polydipsia and polyuria.       Hypothyroid   Genitourinary:  Negative for dyspareunia, dysuria, flank pain, frequency and urgency.  Musculoskeletal:  Negative for arthralgias, back pain and myalgias.  Skin:  Negative for rash.   Allergic/Immunologic: Negative for environmental allergies.  Neurological:  Negative for dizziness, weakness and headaches.  Hematological:  Negative for adenopathy.  Psychiatric/Behavioral:  Positive for decreased concentration. The patient is not nervous/anxious.        Objective     Today's Vitals   03/20/22 1452  BP: 121/71  Pulse: 78  SpO2: 100%  Weight: 160 lb 1.9 oz (72.6 kg)  Height: 5' 10.08" (1.78 m)   Body mass index is 22.92 kg/m.   BP Readings from Last 3 Encounters:  03/20/22 121/71  01/25/22 103/67  11/02/21 107/61    Wt Readings from Last 3 Encounters:  03/20/22 160 lb 1.9 oz (72.6 kg)  01/25/22 164 lb (74.4 kg)  11/02/21 170 lb 9.6 oz (77.4 kg)    Physical Exam Vitals and nursing note reviewed.  Constitutional:      Appearance: Normal appearance. She is well-developed.  HENT:     Head: Normocephalic and atraumatic.  Eyes:     Pupils: Pupils are equal, round, and reactive to light.  Cardiovascular:     Rate and Rhythm: Normal rate and regular rhythm.     Pulses: Normal pulses.     Heart sounds: Normal heart sounds.  Pulmonary:     Effort: Pulmonary effort is normal.     Breath sounds: Normal breath sounds.  Abdominal:     Palpations: Abdomen is soft.  Musculoskeletal:        General: Normal range of motion.     Cervical back: Normal range of motion and neck supple.  Lymphadenopathy:  Cervical: No cervical adenopathy.  Skin:    General: Skin is warm and dry.     Capillary Refill: Capillary refill takes less than 2 seconds.  Neurological:     General: No focal deficit present.     Mental Status: She is alert and oriented to person, place, and time.  Psychiatric:        Mood and Affect: Mood normal.        Behavior: Behavior normal.        Thought Content: Thought content normal.        Judgment: Judgment normal.       Assessment & Plan    1. Acquired hypothyroidism Thyroid panel stable. Continue levothyroxine as prescribed    2. Attention deficit disorder (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 Increase dose of adderall to 20 mg. She may take 1/2 to 1 tablet up to  twice daily as needed. Send three 30 day prescriptions to her pharamcy. Dates are 03/20/2022, 04/17/2022, and 05/16/2022.  - amphetamine-dextroamphetamine (ADDERALL) 20 MG tablet; Take 1 tablet (20 mg total) by mouth 2 (two) times daily as needed.  Dispense: 60 tablet; Refill: 0    Problem List Items Addressed This Visit       Endocrine   Hypothyroidism - Primary     Other   Attention deficit disorder (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   Relevant Medications   amphetamine-dextroamphetamine (ADDERALL) 20 MG tablet     Return in about 3 months (around 06/19/2022) for mood, ADD - please add patient to flu shot clinic.  will need thyroid check at next visit .         Carlean Jews, NP  [facility] at [facility] [phone] (phone) [phone] (fax)  [facility]

## 2022-04-26 ENCOUNTER — Ambulatory Visit: Payer: Medicaid Other | Admitting: Nurse Practitioner

## 2022-05-13 DIAGNOSIS — N390 Urinary tract infection, site not specified: Secondary | ICD-10-CM | POA: Diagnosis not present

## 2022-05-13 DIAGNOSIS — J069 Acute upper respiratory infection, unspecified: Secondary | ICD-10-CM | POA: Diagnosis not present

## 2022-05-13 DIAGNOSIS — Z20822 Contact with and (suspected) exposure to covid-19: Secondary | ICD-10-CM | POA: Diagnosis not present

## 2022-05-19 DIAGNOSIS — J209 Acute bronchitis, unspecified: Secondary | ICD-10-CM | POA: Diagnosis not present

## 2022-06-06 ENCOUNTER — Ambulatory Visit: Payer: Medicaid Other | Admitting: Orthopaedic Surgery

## 2022-06-14 ENCOUNTER — Other Ambulatory Visit: Payer: Self-pay | Admitting: Nurse Practitioner

## 2022-06-14 DIAGNOSIS — F988 Other specified behavioral and emotional disorders with onset usually occurring in childhood and adolescence: Secondary | ICD-10-CM

## 2022-06-19 ENCOUNTER — Ambulatory Visit (INDEPENDENT_AMBULATORY_CARE_PROVIDER_SITE_OTHER): Payer: Medicaid Other | Admitting: Nurse Practitioner

## 2022-06-19 ENCOUNTER — Encounter: Payer: Self-pay | Admitting: Nurse Practitioner

## 2022-06-19 VITALS — BP 112/76 | HR 79 | Ht 70.08 in | Wt 153.4 lb

## 2022-06-19 DIAGNOSIS — M5090 Cervical disc disorder, unspecified, unspecified cervical region: Secondary | ICD-10-CM | POA: Diagnosis not present

## 2022-06-19 DIAGNOSIS — F988 Other specified behavioral and emotional disorders with onset usually occurring in childhood and adolescence: Secondary | ICD-10-CM | POA: Diagnosis not present

## 2022-06-19 DIAGNOSIS — E039 Hypothyroidism, unspecified: Secondary | ICD-10-CM | POA: Diagnosis not present

## 2022-06-19 DIAGNOSIS — Z23 Encounter for immunization: Secondary | ICD-10-CM | POA: Diagnosis not present

## 2022-06-19 MED ORDER — AMPHETAMINE-DEXTROAMPHETAMINE 20 MG PO TABS: 20.0000 mg | ORAL_TABLET | Freq: Two times a day (BID) | ORAL | 0 refills | Status: DC

## 2022-06-19 MED ORDER — LEVOTHYROXINE SODIUM 50 MCG PO TABS: 50.0000 ug | ORAL_TABLET | Freq: Every day | ORAL | 1 refills | Status: DC

## 2022-06-19 NOTE — Progress Notes (Signed)
Established patient visit   Patient: Vanessa Zavala   DOB: Nov 20, 1995   26 y.o. Female  MRN: [id number] Visit Date: 06/19/2022   Chief Complaint  Patient presents with   Follow-up   Subjective    Neck Pain  This is a chronic problem. The current episode started more than 1 month ago. The problem occurs 2 to 4 times per day. The problem has been gradually worsening. The pain is associated with nothing. The pain is present in the midline and left side. The quality of the pain is described as burning, shooting and stabbing. The pain is moderate. The symptoms are aggravated by position and stress. The pain is Worse during the night. Stiffness is present At night. Associated symptoms include headaches, numbness, tingling and weakness. She has tried neck support, heat and bed rest for the symptoms. The treatment provided no relief.     Attention deficit disorder  Takes adderall twice daily as needed.  Doing well on current dose  Overdose risk score is average at 280 with no red flags or state indicators    Medications: Outpatient Medications Prior to Visit  Medication Sig   [DISCONTINUED] amphetamine-dextroamphetamine (ADDERALL) 20 MG tablet TAKE 1 TABLET BY MOUTH 2 TIMES DAILY AS NEEDED. FILL AFTER 04/17/2022   [DISCONTINUED] levothyroxine (SYNTHROID) 50 MCG tablet Take 1 tablet (50 mcg total) by mouth daily.   [DISCONTINUED] nitrofurantoin, macrocrystal-monohydrate, (MACROBID) 100 MG capsule Take 1 capsule (100 mg total) by mouth 2 (two) times daily.   [DISCONTINUED] tretinoin (RETIN-A) 0.025 % cream Apply topically at bedtime.   No facility-administered medications prior to visit.    Review of Systems  Musculoskeletal:  Positive for neck pain.  Neurological:  Positive for tingling, weakness, numbness and headaches.    Last CBC Lab Results  Component Value Date   WBC 21.9 (H) 09/19/2020   HGB 11.1 (L) 09/19/2020   HCT 31.5 (L) 09/19/2020   MCV 89.7 09/19/2020   MCH 31.6  09/19/2020   RDW 14.3 09/19/2020   PLT 215 09/19/2020   Last metabolic panel Lab Results  Component Value Date   GLUCOSE 99 09/18/2020   NA 138 09/18/2020   K 3.7 09/18/2020   CL 110 09/18/2020   CO2 21 (L) 09/18/2020   BUN 9 09/18/2020   CREATININE 0.63 09/18/2020   GFRNONAA >60 09/18/2020   CALCIUM 9.6 09/18/2020   PROT 6.6 09/18/2020   ALBUMIN 3.1 (L) 09/18/2020   BILITOT 0.6 09/18/2020   ALKPHOS 119 09/18/2020   AST 20 09/18/2020   ALT 16 09/18/2020   ANIONGAP 7 09/18/2020    Last thyroid functions Lab Results  Component Value Date   TSH 40.400 (H) 01/25/2022      Objective     Today's Vitals   06/19/22 1022  BP: 112/76  Pulse: 79  SpO2: 99%  Weight: 153 lb 6.4 oz (69.6 kg)  Height: 5' 10.08" (1.78 m)   Body mass index is 21.96 kg/m.  BP Readings from Last 3 Encounters:  06/19/22 112/76  03/20/22 121/71  01/25/22 103/67    Wt Readings from Last 3 Encounters:  06/19/22 153 lb 6.4 oz (69.6 kg)  03/20/22 160 lb 1.9 oz (72.6 kg)  01/25/22 164 lb (74.4 kg)    Physical Exam Vitals and nursing note reviewed.  Constitutional:      Appearance: Normal appearance. She is well-developed.  HENT:     Head: Normocephalic and atraumatic.  Eyes:     Pupils: Pupils are equal, round, and reactive  to light.  Neck:     Thyroid: Thyromegaly present.  Cardiovascular:     Rate and Rhythm: Normal rate and regular rhythm.     Pulses: Normal pulses.     Heart sounds: Normal heart sounds.  Pulmonary:     Effort: Pulmonary effort is normal.     Breath sounds: Normal breath sounds.  Abdominal:     Palpations: Abdomen is soft.  Musculoskeletal:     Cervical back: Neck supple. Muscular tenderness present. Decreased range of motion.  Lymphadenopathy:     Cervical: No cervical adenopathy.  Skin:    General: Skin is warm and dry.     Capillary Refill: Capillary refill takes less than 2 seconds.  Neurological:     General: No focal deficit present.     Mental  Status: She is alert and oriented to person, place, and time.  Psychiatric:        Mood and Affect: Mood normal.        Behavior: Behavior normal.        Thought Content: Thought content normal.        Judgment: Judgment normal.      Assessment & Plan    1. Cervical neck pain with evidence of disc disease Unclear if this is muscular in nature or arthritic. She may benefit from physical therapy. Refer to orthopedics for further  evaluation  - Ambulatory referral to Orthopedic Surgery  2. Attention deficit disorder (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 Continue adderall 20 mg up to twice daily as needed; three 30 day prescriptions sent to her pharmacy. Ddates are 06/19/2022, 07/18/2022, and 08/16/2022.  - amphetamine-dextroamphetamine (ADDERALL) 20 MG tablet; Take 1 tablet (20 mg total) by mouth 2 (two) times daily.  Dispense: 60 tablet; Refill: 0  3. Acquired hypothyroidism Continue levothyroxine as prescribed. Check thyroid panel prior to next visit and adjust dose levothyroxine as indicated.  - levothyroxine (SYNTHROID) 50 MCG tablet; Take 1 tablet (50 mcg total) by mouth daily.  Dispense: 90 tablet; Refill: 1  4. Need for influenza vaccination Flu vaccine administered during today's visit.  - Flu Vaccine QUAD 6+ mos PF IM (Fluarix Quad PF)   Problem List Items Addressed This Visit       Endocrine   Hypothyroidism   Relevant Medications   levothyroxine (SYNTHROID) 50 MCG tablet     Musculoskeletal and Integument   Cervical neck pain with evidence of disc disease - Primary   Relevant Orders   Ambulatory referral to Orthopedic Surgery     Other   Attention deficit disorder (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   Relevant Medications   amphetamine-dextroamphetamine (ADDERALL) 20 MG tablet   Other Visit Diagnoses     Need for influenza vaccination       Relevant Orders   Flu Vaccine QUAD 6+ mos PF IM (Fluarix Quad PF) (Completed)        Return in about 3 months (around 09/18/2022) for thyroid, ADD - check TSH and  Free T4 a week prior to visit.         Carlean Jews, NP  [facility] at [facility] [phone] (phone) [phone] (fax)  [facility]

## 2022-07-05 ENCOUNTER — Ambulatory Visit: Payer: Medicaid Other | Admitting: Orthopedic Surgery

## 2022-07-08 DIAGNOSIS — M5090 Cervical disc disorder, unspecified, unspecified cervical region: Secondary | ICD-10-CM | POA: Insufficient documentation

## 2022-08-28 ENCOUNTER — Encounter (HOSPITAL_BASED_OUTPATIENT_CLINIC_OR_DEPARTMENT_OTHER): Payer: Self-pay | Admitting: Pediatrics

## 2022-08-28 ENCOUNTER — Emergency Department (HOSPITAL_BASED_OUTPATIENT_CLINIC_OR_DEPARTMENT_OTHER)
Admission: EM | Admit: 2022-08-28 | Discharge: 2022-08-28 | Disposition: A | Discharge status: Home / Self Care | Payer: Medicaid Other | Attending: Emergency Medicine | Admitting: Emergency Medicine

## 2022-08-28 ENCOUNTER — Other Ambulatory Visit: Payer: Self-pay

## 2022-08-28 DIAGNOSIS — E039 Hypothyroidism, unspecified: Secondary | ICD-10-CM | POA: Diagnosis not present

## 2022-08-28 DIAGNOSIS — R6889 Other general symptoms and signs: Secondary | ICD-10-CM | POA: Insufficient documentation

## 2022-08-28 DIAGNOSIS — R231 Pallor: Secondary | ICD-10-CM | POA: Diagnosis not present

## 2022-08-28 DIAGNOSIS — L950 Livedoid vasculitis: Secondary | ICD-10-CM | POA: Diagnosis not present

## 2022-08-28 DIAGNOSIS — R531 Weakness: Secondary | ICD-10-CM | POA: Diagnosis present

## 2022-08-28 LAB — CBC WITH DIFFERENTIAL/PLATELET
Abs Immature Granulocytes: 0.02 10*3/uL (ref 0.00–0.07)
Basophils Absolute: 0.1 10*3/uL (ref 0.0–0.1)
Basophils Relative: 1 %
Eosinophils Absolute: 0.2 10*3/uL (ref 0.0–0.5)
Eosinophils Relative: 3 %
HCT: 38 % (ref 36.0–46.0)
Hemoglobin: 12.9 g/dL (ref 12.0–15.0)
Immature Granulocytes: 0 %
Lymphocytes Relative: 33 %
Lymphs Abs: 2.2 10*3/uL (ref 0.7–4.0)
MCH: 29.2 pg (ref 26.0–34.0)
MCHC: 33.9 g/dL (ref 30.0–36.0)
MCV: 86 fL (ref 80.0–100.0)
Monocytes Absolute: 0.4 10*3/uL (ref 0.1–1.0)
Monocytes Relative: 6 %
Neutro Abs: 3.9 10*3/uL (ref 1.7–7.7)
Neutrophils Relative %: 57 %
Platelets: 338 10*3/uL (ref 150–400)
RBC: 4.42 MIL/uL (ref 3.87–5.11)
RDW: 13.2 % (ref 11.5–15.5)
WBC: 6.8 10*3/uL (ref 4.0–10.5)
nRBC: 0 % (ref 0.0–0.2)

## 2022-08-28 LAB — COMPREHENSIVE METABOLIC PANEL
ALT: 9 U/L (ref 0–44)
AST: 11 U/L — ABNORMAL LOW (ref 15–41)
Albumin: 4.2 g/dL (ref 3.5–5.0)
Alkaline Phosphatase: 48 U/L (ref 38–126)
Anion gap: 5 (ref 5–15)
BUN: 11 mg/dL (ref 6–20)
CO2: 27 mmol/L (ref 22–32)
Calcium: 8.9 mg/dL (ref 8.9–10.3)
Chloride: 106 mmol/L (ref 98–111)
Creatinine, Ser: 0.89 mg/dL (ref 0.44–1.00)
GFR, Estimated: >60 mL/min (ref >60)
Glucose, Bld: 88 mg/dL (ref 70–99)
Potassium: 3.4 mmol/L — ABNORMAL LOW (ref 3.5–5.1)
Sodium: 138 mmol/L (ref 135–145)
Total Bilirubin: 0.7 mg/dL (ref 0.3–1.2)
Total Protein: 7.2 g/dL (ref 6.5–8.1)

## 2022-08-28 LAB — PREGNANCY, URINE: Preg Test, Ur: NEGATIVE

## 2022-08-28 LAB — TSH: TSH: 14.047 u[IU]/mL — ABNORMAL HIGH (ref 0.350–4.500)

## 2022-08-28 LAB — URINALYSIS, ROUTINE W REFLEX MICROSCOPIC
Bilirubin Urine: NEGATIVE
Glucose, UA: NEGATIVE mg/dL
Hgb urine dipstick: NEGATIVE
Ketones, ur: NEGATIVE mg/dL
Leukocytes,Ua: NEGATIVE
Nitrite: NEGATIVE
Protein, ur: NEGATIVE mg/dL
Specific Gravity, Urine: 1.025 (ref 1.005–1.030)
pH: 7 (ref 5.0–8.0)

## 2022-08-28 MED ORDER — SODIUM CHLORIDE 0.9 % IV BOLUS
1000.0000 mL | Freq: Once | INTRAVENOUS | Status: AC
  Administered 2022-08-28: 1000 mL via INTRAVENOUS

## 2022-08-28 NOTE — ED Provider Notes (Signed)
[place] EMERGENCY DEPARTMENT AT [facility] [place] Provider Note   CSN: [id number] Arrival date & time: 08/28/22  1129     History  Chief Complaint  Patient presents with   Skin Discoloration   Extremity Weakness    Vanessa Zavala is a 27 y.o. female.  Pt is a 27 yo female with pmhx significant for Hashimoto's thyroiditis, adhd, and pcos.  Pt is here because she is always cold.  She feels weak all over.  She also has some skin changes.  She is concerned her thyroid is not working correctly.  She's been compliant with her thyroid meds.        Home Medications Prior to Admission medications   Medication Sig Start Date End Date Taking? Authorizing Provider  amphetamine-dextroamphetamine (ADDERALL) 20 MG tablet Take 1 tablet (20 mg total) by mouth 2 (two) times daily. 06/19/22   Ronnell Freshwater, NP  levothyroxine (SYNTHROID) 50 MCG tablet Take 1 tablet (50 mcg total) by mouth daily. 06/19/22   Ronnell Freshwater, NP      Allergies    Patient has no known allergies.    Review of Systems   Review of Systems  Endocrine: Positive for cold intolerance.  Skin:  Positive for color change.  Neurological:  Positive for weakness.  All other systems reviewed and are negative.   Physical Exam Updated Vital Signs BP 103/74   Pulse 83   Temp 98.6 F (37 C) (Oral)   Resp 18   Ht 5' 10"$  (1.778 m)   Wt 65.8 kg   LMP 07/12/2022   SpO2 100%   BMI 20.81 kg/m  Physical Exam Vitals and nursing note reviewed.  Constitutional:      Appearance: Normal appearance.  HENT:     Head: Normocephalic and atraumatic.     Right Ear: External ear normal.     Left Ear: External ear normal.     Nose: Nose normal.     Mouth/Throat:     Mouth: Mucous membranes are moist.     Pharynx: Oropharynx is clear.  Eyes:     Extraocular Movements: Extraocular movements intact.     Conjunctiva/sclera: Conjunctivae normal.     Pupils: Pupils are equal, round, and reactive to light.   Cardiovascular:     Rate and Rhythm: Normal rate and regular rhythm.     Pulses: Normal pulses.     Heart sounds: Normal heart sounds.  Pulmonary:     Effort: Pulmonary effort is normal.     Breath sounds: Normal breath sounds.  Abdominal:     General: Abdomen is flat. Bowel sounds are normal.     Palpations: Abdomen is soft.  Musculoskeletal:        General: Normal range of motion.     Cervical back: Normal range of motion and neck supple.  Skin:    General: Skin is warm.     Capillary Refill: Capillary refill takes less than 2 seconds.  Neurological:     General: No focal deficit present.     Mental Status: She is alert and oriented to person, place, and time.  Psychiatric:        Mood and Affect: Mood normal.        Behavior: Behavior normal.     ED Results / Procedures / Treatments   Labs (all labs ordered are listed, but only abnormal results are displayed) Labs Reviewed  COMPREHENSIVE METABOLIC PANEL - Abnormal; Notable for the following components:  Result Value   Potassium 3.4 (*)    AST 11 (*)    All other components within normal limits  CBC WITH DIFFERENTIAL/PLATELET  PREGNANCY, URINE  URINALYSIS, ROUTINE W REFLEX MICROSCOPIC  TSH    EKG EKG Interpretation  Date/Time:  Wednesday August 28 2022 13:56:20 EST Ventricular Rate:  77 PR Interval:  152 QRS Duration: 92 QT Interval:  384 QTC Calculation: 435 R Axis:   79 Text Interpretation: Sinus rhythm No significant change since last tracing Confirmed by Isla Pence [phone] on 08/28/2022 3:17:00 PM  Radiology No results found.  Procedures Procedures    Medications Ordered in ED Medications  sodium chloride 0.9 % bolus 1,000 mL (0 mLs Intravenous Stopped 08/28/22 1454)    ED Course/ Medical Decision Making/ A&P                             Medical Decision Making Amount and/or Complexity of Data Reviewed Labs: ordered.   This patient presents to the ED for concern of multiple  complaints, this involves an extensive number of treatment options, and is a complaint that carries with it a high risk of complications and morbidity.  The differential diagnosis includes hypothyroidism, electrolyte abn, anemia, infection   Co morbidities that complicate the patient evaluation   Hashimoto's thyroiditis, adhd, and pcos   Additional history obtained:  Additional history obtained from epic chart review External records from outside source obtained and reviewed including husband   Lab Tests:  I Ordered, and personally interpreted labs.  The pertinent results include:  cbc nl, cmp nl  Cardiac Monitoring:  The patient was maintained on a cardiac monitor.  I personally viewed and interpreted the cardiac monitored which showed an underlying rhythm of: nsr   Medicines ordered and prescription drug management:  I ordered medication including ivfs  for sx  Reevaluation of the patient after these medicines showed that the patient improved I have reviewed the patients home medicines and have made adjustments as needed   Problem List / ED Course:  Skin changes:  pt shows me pictures.  They look like livedo reticularis. Hypothyroidism:  TSH pending.  Pt is to f/u with pcp.  Return if worse.   Reevaluation:  After the interventions noted above, I reevaluated the patient and found that they have :improved   Social Determinants of Health:  Lives at home   Dispostion:  After consideration of the diagnostic results and the patients response to treatment, I feel that the patent would benefit from discharge with outpatient f/u.          Final Clinical Impression(s) / ED Diagnoses Final diagnoses:  Livedo reticularis  Cold intolerance  Hypothyroidism, unspecified type    Rx / DC Orders ED Discharge Orders     None         Isla Pence, MD 08/28/22 [phone]

## 2022-08-28 NOTE — ED Triage Notes (Signed)
C/O for "circulation issue" for a month. Shows picture of arms and legs with that appears to have telangiectasias. Reports thyroid levels are high and has some cold intolerance and had a low blood sugar of 59 x 1 occurrence. C/O upper back neck and back pain with extremities weakness as well.

## 2022-08-28 NOTE — Discharge Instructions (Addendum)
Check "My Chart" for your TSH levels.

## 2022-09-04 DIAGNOSIS — Z113 Encounter for screening for infections with a predominantly sexual mode of transmission: Secondary | ICD-10-CM | POA: Diagnosis not present

## 2022-09-04 DIAGNOSIS — N342 Other urethritis: Secondary | ICD-10-CM | POA: Diagnosis not present

## 2022-09-04 DIAGNOSIS — R369 Urethral discharge, unspecified: Secondary | ICD-10-CM | POA: Diagnosis not present

## 2022-09-05 ENCOUNTER — Telehealth: Payer: Medicaid Other

## 2022-09-05 NOTE — Telephone Encounter (Signed)
Pt called to see if she still needed the appt for labs on 09/11/22 since she went to the ER and the TSH was drawn then and it was 14.047 on 08/28/22. Pt states that she increased her levothyroxine (SYNTHROID) 50 MCG tablet to 100 mcg on her own.  Pt is also requesting a medication to treat BV. Pt didn't leave any details of how that was Dx but requesting treatment.   Please advise

## 2022-09-05 NOTE — Telephone Encounter (Signed)
At her visit, she could do testing for gonorrhea, chlamydia, and trichomoniasis. I think we should do that during her regular visit though, so I can decide about treatment if needed.

## 2022-09-05 NOTE — Telephone Encounter (Signed)
The answer to that would be no. We would need to recheck the thyroid panel prior to the next visit after the one coming up.

## 2022-09-05 NOTE — Telephone Encounter (Signed)
Pt is also asking if any STD labs can be completed during this visit on 3/6. Pt current partner after intercourse she noticed that he had blood in his Semen since then she has experienced some odor and discharge,  Please advise

## 2022-09-06 ENCOUNTER — Other Ambulatory Visit: Payer: Self-pay | Admitting: Nurse Practitioner

## 2022-09-06 DIAGNOSIS — E039 Hypothyroidism, unspecified: Secondary | ICD-10-CM

## 2022-09-06 DIAGNOSIS — Z0000 Encounter for general adult medical examination without abnormal findings: Secondary | ICD-10-CM

## 2022-09-11 ENCOUNTER — Other Ambulatory Visit: Payer: Medicaid Other

## 2022-09-11 DIAGNOSIS — E039 Hypothyroidism, unspecified: Secondary | ICD-10-CM | POA: Diagnosis not present

## 2022-09-11 DIAGNOSIS — Z0000 Encounter for general adult medical examination without abnormal findings: Secondary | ICD-10-CM

## 2022-09-11 NOTE — Telephone Encounter (Signed)
Ok. So do I understand that we are doing the labs during her visit? Is it a morning appointment?

## 2022-09-11 NOTE — Telephone Encounter (Signed)
Not before the visit, correct?

## 2022-09-11 NOTE — Telephone Encounter (Signed)
She was not very clear about that part of it. She didn't want to keep her lab appointment prior to the visit. We can get it done at her visit. We can talk and get all of the labs she needs and wants.

## 2022-09-12 LAB — T4: T4, Total: 7 ug/dL (ref 4.5–12.0)

## 2022-09-12 LAB — HEMOGLOBIN A1C
Est. average glucose Bld gHb Est-mCnc: 97 mg/dL
Hgb A1c MFr Bld: 5 % (ref 4.8–5.6)

## 2022-09-12 LAB — LIPID PANEL
Chol/HDL Ratio: 2.3 ratio (ref 0.0–4.4)
Cholesterol, Total: 136 mg/dL (ref 100–199)
HDL: 58 mg/dL (ref >39)
LDL Chol Calc (NIH): 65 mg/dL (ref 0–99)
Triglycerides: 61 mg/dL (ref 0–149)
VLDL Cholesterol Cal: 13 mg/dL (ref 5–40)

## 2022-09-12 LAB — T3, FREE: T3, Free: 2.9 pg/mL (ref 2.0–4.4)

## 2022-09-18 ENCOUNTER — Encounter: Payer: Self-pay | Admitting: Nurse Practitioner

## 2022-09-18 ENCOUNTER — Ambulatory Visit (INDEPENDENT_AMBULATORY_CARE_PROVIDER_SITE_OTHER): Payer: Medicaid Other | Admitting: Nurse Practitioner

## 2022-09-18 VITALS — BP 100/71 | HR 71 | Ht 70.0 in | Wt 148.4 lb

## 2022-09-18 DIAGNOSIS — E039 Hypothyroidism, unspecified: Secondary | ICD-10-CM

## 2022-09-18 DIAGNOSIS — F988 Other specified behavioral and emotional disorders with onset usually occurring in childhood and adolescence: Secondary | ICD-10-CM | POA: Diagnosis not present

## 2022-09-18 DIAGNOSIS — Z202 Contact with and (suspected) exposure to infections with a predominantly sexual mode of transmission: Secondary | ICD-10-CM | POA: Diagnosis not present

## 2022-09-18 MED ORDER — AMPHETAMINE-DEXTROAMPHETAMINE 20 MG PO TABS: 20.0000 mg | ORAL_TABLET | Freq: Two times a day (BID) | ORAL | 0 refills | Status: DC

## 2022-09-18 MED ORDER — LEVOTHYROXINE SODIUM 100 MCG PO TABS: 100.0000 ug | ORAL_TABLET | Freq: Every day | ORAL | 3 refills | Status: DC

## 2022-09-18 NOTE — Progress Notes (Signed)
Established patient visit   Patient: Vanessa Zavala   DOB: 1996/04/25   27 y.o. Female  MRN: [id number] Visit Date: 09/18/2022   Chief Complaint  Patient presents with   Medical Management of Chronic Issues   Subjective    HPI  Follow up  -hypothyroid --recently was seen in ER.  --tsh was > 14 --levothyroxine dose increase to 100 mcg daily  -T4 and Free T3 were both normal last week   Would like to be tested for STDs -partner was found to be having intercourse with other partners, some were same sex partners. Unsure if this partner was being safe.  -did have brown vaginal discharge at one time with bad odor.  -was treated for UTI.  -she is still noticing some vaginal discharge and some burning with urination.   ADD -currently on Adderall 20 mg twice daily  -dose is doing well  -no negative side effects.    Medications: Outpatient Medications Prior to Visit  Medication Sig   [DISCONTINUED] amphetamine-dextroamphetamine (ADDERALL) 20 MG tablet Take 1 tablet (20 mg total) by mouth 2 (two) times daily.   [DISCONTINUED] levothyroxine (SYNTHROID) 50 MCG tablet Take 1 tablet (50 mcg total) by mouth daily.   No facility-administered medications prior to visit.    Review of Systems See HPI     Last CBC Lab Results  Component Value Date   WBC 6.8 08/28/2022   HGB 12.9 08/28/2022   HCT 38.0 08/28/2022   MCV 86.0 08/28/2022   MCH 29.2 08/28/2022   RDW 13.2 08/28/2022   PLT 338 [id number]   Last metabolic panel Lab Results  Component Value Date   GLUCOSE 88 08/28/2022   NA 138 08/28/2022   K 3.4 (L) 08/28/2022   CL 106 08/28/2022   CO2 27 08/28/2022   BUN 11 08/28/2022   CREATININE 0.89 08/28/2022   GFRNONAA >60 08/28/2022   CALCIUM 8.9 08/28/2022   PROT 7.2 08/28/2022   ALBUMIN 4.2 08/28/2022   BILITOT 0.7 08/28/2022   ALKPHOS 48 08/28/2022   AST 11 (L) 08/28/2022   ALT 9 08/28/2022   ANIONGAP 5 08/28/2022   Last lipids Lab Results  Component Value  Date   CHOL 136 09/11/2022   HDL 58 09/11/2022   LDLCALC 65 09/11/2022   TRIG 61 09/11/2022   CHOLHDL 2.3 09/11/2022   Last hemoglobin A1c Lab Results  Component Value Date   HGBA1C 5.0 09/11/2022   Last thyroid functions Lab Results  Component Value Date   TSH 14.047 (H) 08/28/2022   T4TOTAL 7.0 09/11/2022        Objective     Today's Vitals   09/18/22 1058  BP: 100/71  Pulse: 71  SpO2: 100%  Weight: 148 lb 6.4 oz (67.3 kg)  Height: '5\' 10"'$  (1.778 m)   Body mass index is 21.29 kg/m.  BP Readings from Last 3 Encounters:  09/18/22 100/71  08/28/22 103/74  06/19/22 112/76    Wt Readings from Last 3 Encounters:  09/18/22 148 lb 6.4 oz (67.3 kg)  08/28/22 145 lb (65.8 kg)  06/19/22 153 lb 6.4 oz (69.6 kg)    Physical Exam Vitals and nursing note reviewed.  Constitutional:      Appearance: Normal appearance. She is well-developed.  HENT:     Head: Normocephalic and atraumatic.     Nose: Nose normal.     Mouth/Throat:     Mouth: Mucous membranes are moist.     Pharynx: Oropharynx is clear.  Eyes:  Extraocular Movements: Extraocular movements intact.     Conjunctiva/sclera: Conjunctivae normal.     Pupils: Pupils are equal, round, and reactive to light.  Cardiovascular:     Rate and Rhythm: Normal rate and regular rhythm.     Pulses: Normal pulses.     Heart sounds: Normal heart sounds.  Pulmonary:     Effort: Pulmonary effort is normal.     Breath sounds: Normal breath sounds.  Abdominal:     Palpations: Abdomen is soft.  Musculoskeletal:        General: Normal range of motion.     Cervical back: Normal range of motion and neck supple.  Lymphadenopathy:     Cervical: No cervical adenopathy.  Skin:    General: Skin is warm and dry.     Capillary Refill: Capillary refill takes less than 2 seconds.  Neurological:     General: No focal deficit present.     Mental Status: She is alert and oriented to person, place, and time.  Psychiatric:         Mood and Affect: Mood normal.        Behavior: Behavior normal.        Thought Content: Thought content normal.        Judgment: Judgment normal.      Assessment & Plan    1. Acquired hypothyroidism Levothyroxine recently increased to 100 mcg daily. New rx sent to her pharmacy. Check thyroid panel prior to next visit and adjust again as indicated.  - levothyroxine (SYNTHROID) 100 MCG tablet; Take 1 tablet (100 mcg total) by mouth daily.  Dispense: 30 tablet; Refill: 3  2. Attention deficit disorder (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 Stable. Continue adderall 20 mg twice daily as needed. Two 30 day prescriptions sent to her pharmacy. Dates are 09/18/2022 and 10/17/2022.  - amphetamine-dextroamphetamine (ADDERALL) 20 MG tablet; Take 1 tablet (20 mg total) by mouth 2 (two) times daily.  Dispense: 60 tablet; Refill: 0  3. Possible exposure to STD Check standard panel for sexually transmitted infections today. Treat as indicated.  - HIV antibody (with reflex); Future - RPR; Future - GC/Chlamydia Probe Amp; Future - Trichomonas vaginalis, RNA - RPR - HIV antibody (with reflex) - GC/Chlamydia Probe Amp   Problem List Items Addressed This Visit       Endocrine   Hypothyroidism - Primary   Relevant Medications   levothyroxine (SYNTHROID) 100 MCG tablet     Other   Attention deficit disorder (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   Relevant Medications   amphetamine-dextroamphetamine (ADDERALL) 20 MG tablet   Other Visit Diagnoses     Possible exposure to STD       Relevant Orders   HIV antibody (with reflex)   RPR   GC/Chlamydia Probe Amp   Trichomonas vaginalis, RNA        Return in about 2 months (around 11/18/2022) for ADD, hypothyroid - check TSH and Free T4 few days prior to visit. Marland Kitchen         Ronnell Freshwater, NP  [facility] [phone] (phone) [phone] (fax)  Jay

## 2022-09-19 LAB — HIV ANTIBODY (ROUTINE TESTING W REFLEX): HIV Screen 4th Generation wRfx: NONREACTIVE

## 2022-09-19 LAB — RPR: RPR Ser Ql: NONREACTIVE

## 2022-09-22 LAB — TRICHOMONAS VAGINALIS, PROBE AMP: Trich vag by NAA: NEGATIVE

## 2022-09-22 LAB — GC/CHLAMYDIA PROBE AMP
Chlamydia trachomatis, NAA: NEGATIVE
Neisseria Gonorrhoeae by PCR: NEGATIVE

## 2022-09-23 NOTE — Progress Notes (Signed)
Please let the patient know that screening for all STDs was negative. Includes gonorrhea, chlamydia, trichomonas, syphilis, and HiV.  Thanks so much.   -HB

## 2022-10-15 ENCOUNTER — Other Ambulatory Visit: Payer: Self-pay | Admitting: Nurse Practitioner

## 2022-10-15 DIAGNOSIS — F988 Other specified behavioral and emotional disorders with onset usually occurring in childhood and adolescence: Secondary | ICD-10-CM

## 2022-10-15 MED ORDER — AMPHETAMINE-DEXTROAMPHETAMINE 20 MG PO TABS: 20.0000 mg | ORAL_TABLET | Freq: Two times a day (BID) | ORAL | 0 refills | Status: DC

## 2022-10-28 ENCOUNTER — Telehealth: Payer: Self-pay

## 2022-10-28 NOTE — Telephone Encounter (Signed)
Patient called office to see if she could get a referral due to patient having Upper abdomen Pressure and bloating after eating, please advise, thanks.

## 2022-10-29 NOTE — Telephone Encounter (Signed)
It's ok.  Thank you.

## 2022-11-06 ENCOUNTER — Other Ambulatory Visit: Payer: Self-pay

## 2022-11-06 DIAGNOSIS — E039 Hypothyroidism, unspecified: Secondary | ICD-10-CM

## 2022-11-12 ENCOUNTER — Other Ambulatory Visit: Payer: Medicaid Other

## 2022-11-12 ENCOUNTER — Other Ambulatory Visit: Payer: Self-pay | Admitting: Nurse Practitioner

## 2022-11-12 ENCOUNTER — Encounter: Payer: Self-pay | Admitting: Nurse Practitioner

## 2022-11-12 DIAGNOSIS — E039 Hypothyroidism, unspecified: Secondary | ICD-10-CM | POA: Diagnosis not present

## 2022-11-12 DIAGNOSIS — F988 Other specified behavioral and emotional disorders with onset usually occurring in childhood and adolescence: Secondary | ICD-10-CM

## 2022-11-12 MED ORDER — AMPHETAMINE-DEXTROAMPHETAMINE 20 MG PO TABS: 20.0000 mg | ORAL_TABLET | Freq: Two times a day (BID) | ORAL | 0 refills | Status: DC

## 2022-11-12 NOTE — Telephone Encounter (Signed)
Sending refill to pick up

## 2022-11-13 LAB — TSH+FREE T4
Free T4: 0.67 ng/dL — ABNORMAL LOW (ref 0.82–1.77)
TSH: 85.4 u[IU]/mL — ABNORMAL HIGH (ref 0.450–4.500)

## 2022-11-14 ENCOUNTER — Encounter: Payer: Self-pay | Admitting: Family Medicine

## 2022-11-14 ENCOUNTER — Ambulatory Visit: Payer: Medicaid Other | Admitting: Family Medicine

## 2022-11-14 ENCOUNTER — Ambulatory Visit (INDEPENDENT_AMBULATORY_CARE_PROVIDER_SITE_OTHER): Payer: Medicaid Other | Admitting: Family Medicine

## 2022-11-14 VITALS — BP 135/76 | HR 63 | Resp 18 | Ht 70.0 in | Wt 158.0 lb

## 2022-11-14 DIAGNOSIS — E063 Autoimmune thyroiditis: Secondary | ICD-10-CM | POA: Diagnosis not present

## 2022-11-14 DIAGNOSIS — M542 Cervicalgia: Secondary | ICD-10-CM

## 2022-11-14 DIAGNOSIS — F988 Other specified behavioral and emotional disorders with onset usually occurring in childhood and adolescence: Secondary | ICD-10-CM

## 2022-11-14 MED ORDER — AMPHETAMINE-DEXTROAMPHETAMINE 20 MG PO TABS: 20.0000 mg | ORAL_TABLET | Freq: Two times a day (BID) | ORAL | 0 refills | Status: DC

## 2022-11-14 MED ORDER — LEVOTHYROXINE SODIUM 112 MCG PO TABS: 112.0000 ug | ORAL_TABLET | Freq: Every day | ORAL | 3 refills | Status: DC

## 2022-11-14 NOTE — Assessment & Plan Note (Signed)
Patient requesting referral to endocrinology, previously saw Dr. Everardo All.  Given low T4 on last check, increasing to levothyroxine 112 mcg daily.  Will recheck TSH and T4 in about 6 weeks in case she is not able to get an appointment with endocrinology before then.  Also ordering an ultrasound of her thyroid given the increase in tenderness of her neck. We discussed that it is possible that fatigue, weight gain, and amenorrhea/irregular menstrual cycles can be secondary to low levels of thyroid hormone.  Encouraged her to continue to monitor the symptoms after increasing her dose of levothyroxine.

## 2022-11-14 NOTE — Assessment & Plan Note (Signed)
Stable, well-controlled.  Continue Adderall 20 mg twice daily.  PMD P reviewed, no aberrancies.  Provided refills for the next 3 months.

## 2022-11-14 NOTE — Progress Notes (Signed)
Established Patient Office Visit  Subjective   Patient ID: Vanessa Zavala, female    DOB: 1995-07-26  Age: 27 y.o. MRN: [id number]  Chief Complaint  Patient presents with   ADD   Hypothyroidism    HPI Vanessa Zavala is a 27 y.o. female presenting today for follow up of ADHD, hypothyroidism. ADHD: Reports symptoms are well controlled on Adderall. Denies any problems with insomnia, chest pain, palpitations, or SOB.   Hypothyroidism: Taking levothyroxine 100 mcg regularly in the AM away from food and vitamins.  Elevated TSH and low free T4 on most recent lab work.  She has been experiencing increased tenderness on her neck for several weeks now.  She also endorses fatigue, unintentional weight gain, and irregular menstrual cycles.  ROS Negative unless otherwise noted in HPI   Objective:     BP 135/76 (BP Location: Left Arm, Patient Position: Sitting, Cuff Size: Normal)   Pulse 63   Resp 18   Ht 5\' 10"  (1.778 m)   Wt 158 lb (71.7 kg)   SpO2 100%   Breastfeeding No   BMI 22.67 kg/m   Physical Exam Constitutional:      General: She is not in acute distress.    Appearance: Normal appearance.  HENT:     Head: Normocephalic and atraumatic.  Neck:     Thyroid: Thyromegaly and thyroid tenderness present.  Cardiovascular:     Rate and Rhythm: Normal rate and regular rhythm.     Pulses: Normal pulses.     Heart sounds: No murmur heard.    No friction rub. No gallop.  Pulmonary:     Effort: Pulmonary effort is normal. No respiratory distress.     Breath sounds: No wheezing, rhonchi or rales.  Musculoskeletal:     Cervical back: Normal range of motion. Muscular tenderness present.  Lymphadenopathy:     Cervical: Cervical adenopathy present.  Skin:    General: Skin is warm and dry.  Neurological:     Mental Status: She is alert and oriented to person, place, and time.     Assessment & Plan:  Attention deficit disorder (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 Assessment & Plan: Stable,  well-controlled.  Continue Adderall 20 mg twice daily.  PMD P reviewed, no aberrancies.  Provided refills for the next 3 months.  Orders: -     Amphetamine-Dextroamphetamine; Take 1 tablet (20 mg total) by mouth 2 (two) times daily.  Dispense: 60 tablet; Refill: 0 -     Amphetamine-Dextroamphetamine; Take 1 tablet (20 mg total) by mouth 2 (two) times daily.  Dispense: 60 tablet; Refill: 0 -     Amphetamine-Dextroamphetamine; Take 1 tablet (20 mg total) by mouth 2 (two) times daily.  Dispense: 60 tablet; Refill: 0  Hypothyroidism, acquired, autoimmune Assessment & Plan: Patient requesting referral to endocrinology, previously saw Dr. Everardo All.  Given low T4 on last check, increasing to levothyroxine 112 mcg daily.  Will recheck TSH and T4 in about 6 weeks in case she is not able to get an appointment with endocrinology before then.  Also ordering an ultrasound of her thyroid given the increase in tenderness of her neck. We discussed that it is possible that fatigue, weight gain, and amenorrhea/irregular menstrual cycles can be secondary to low levels of thyroid hormone.  Encouraged her to continue to monitor the symptoms after increasing her dose of levothyroxine.  Orders: -     Levothyroxine Sodium; Take 1 tablet (112 mcg total) by mouth daily before breakfast.  Dispense: 30  tablet; Refill: 3 -     Ambulatory referral to Endocrinology -     US THYROID; Future -     TSH; Future -     T4, free; Future  Tenderness of neck -     US THYROID; Future   Return in about 6 weeks (around 12/26/2022) for blood work (not fasting) - TSH and free T4.  Return for office visit in about 3 months for ADHD.  Melida Quitter, PA

## 2022-11-18 DIAGNOSIS — R1010 Upper abdominal pain, unspecified: Secondary | ICD-10-CM | POA: Diagnosis not present

## 2022-11-18 DIAGNOSIS — K5900 Constipation, unspecified: Secondary | ICD-10-CM | POA: Diagnosis not present

## 2022-11-19 ENCOUNTER — Ambulatory Visit: Payer: Medicaid Other | Admitting: Nurse Practitioner

## 2022-12-12 ENCOUNTER — Ambulatory Visit
Admission: EM | Admit: 2022-12-12 | Discharge: 2022-12-12 | Disposition: A | Discharge status: Home / Self Care | Payer: Medicaid Other | Attending: Nurse Practitioner | Admitting: Nurse Practitioner

## 2022-12-12 DIAGNOSIS — N3001 Acute cystitis with hematuria: Secondary | ICD-10-CM | POA: Insufficient documentation

## 2022-12-12 LAB — POCT URINALYSIS DIP (MANUAL ENTRY)
Bilirubin, UA: NEGATIVE
Glucose, UA: NEGATIVE mg/dL
Ketones, POC UA: NEGATIVE mg/dL
Nitrite, UA: POSITIVE — AB
Protein Ur, POC: 30 mg/dL — AB
Spec Grav, UA: 1.025 (ref 1.010–1.025)
Urobilinogen, UA: 0.2 E.U./dL
pH, UA: 7 (ref 5.0–8.0)

## 2022-12-12 LAB — POCT URINE PREGNANCY: Preg Test, Ur: NEGATIVE

## 2022-12-12 MED ORDER — CEPHALEXIN 500 MG PO CAPS: 500.0000 mg | ORAL_CAPSULE | Freq: Two times a day (BID) | ORAL | 0 refills | Status: AC

## 2022-12-12 NOTE — Discharge Instructions (Signed)
Keflex twice daily for 7 days.  The clinic will contact you with results of the urine culture done today Follow-up with your gynecologist or the health department for any additional testing for Ureaplasma Please go to the ER if you have Any worsening symptoms

## 2022-12-12 NOTE — ED Provider Notes (Signed)
[facility]    CSN: [id number] Arrival date & time: 12/12/22  1449      History   Chief Complaint Chief Complaint  Patient presents with   Urinary Frequency    HPI Vanessa Zavala is a 27 y.o. female presents for evaluation of dysuria.  Patient reports 3 days of urinary burning, urgency, frequency.  Denies any fevers, nausea/vomiting, flank pain.  No hematuria.  Does report a history of postcoital UTIs.  In addition she reports herself and her partner were recently treated for Ureaplasma.  States her partner did not complete his treatment and they have since had intercourse.  Patient states she has now been having some brown discharge that was similar to when she initially was diagnosed.  She would like to be rechecked for this.  Denies any other STD exposure.  No OTC medications have been used since onset.  No other concerns at this time.   Urinary Frequency    Past Medical History:  Diagnosis Date   Abdominal pain    Goiter    Hashimoto's disease    Hypothyroidism, acquired, autoimmune    Oligomenorrhea    PCOS (polycystic ovarian syndrome)    Thyroiditis, autoimmune     Patient Active Problem List   Diagnosis Date Noted   Cervical neck pain with evidence of disc disease 07/08/2022   Acne vulgaris 02/10/2022   Raynaud's phenomenon without gangrene 02/10/2022   Grade IV hemorrhoids 10/25/2021   Overweight with body mass index (BMI) 25.0-29.9 07/25/2021   Body mass index 29.0-29.9, adult 04/26/2021   PCOS (polycystic ovarian syndrome) 03/27/2021   Attention deficit disorder (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 03/27/2021   Goiter    Hypothyroidism, acquired, autoimmune 11/29/2010    Past Surgical History:  Procedure Laterality Date   NO PAST SURGERIES      OB History     Gravida  1   Para  1   Term  1   Preterm      AB      Living  1      SAB      IAB      Ectopic      Multiple  0   Live Births  1            Home Medications    Prior to  Admission medications   Medication Sig Start Date End Date Taking? Authorizing Provider  cephALEXin (KEFLEX) 500 MG capsule Take 1 capsule (500 mg total) by mouth 2 (two) times daily for 7 days. 12/12/22 12/19/22 Yes Radford Pax, NP  amphetamine-dextroamphetamine (ADDERALL) 20 MG tablet Take 1 tablet (20 mg total) by mouth 2 (two) times daily. 11/14/22   Melida Quitter, PA  amphetamine-dextroamphetamine (ADDERALL) 20 MG tablet Take 1 tablet (20 mg total) by mouth 2 (two) times daily. 12/12/22   Melida Quitter, PA  amphetamine-dextroamphetamine (ADDERALL) 20 MG tablet Take 1 tablet (20 mg total) by mouth 2 (two) times daily. 01/08/23   Melida Quitter, PA  levothyroxine (SYNTHROID) 112 MCG tablet Take 1 tablet (112 mcg total) by mouth daily before breakfast. 11/14/22   Melida Quitter, PA    Family History Family History  Problem Relation Age of Onset   Thyroid disease Mother    Diabetes Maternal Grandmother    Cancer Maternal Grandmother    Thyroid disease Maternal Grandmother     Social History Social History   Tobacco Use   Smoking status: Former    Types: E-cigarettes  Smokeless tobacco: Never  Vaping Use   Vaping Use: Every day  Substance Use Topics   Alcohol use: Not Currently    Comment: occasionally   Drug use: Not Currently    Types: Marijuana     Allergies   Patient has no known allergies.   Review of Systems Review of Systems  Genitourinary:  Positive for dysuria, frequency and vaginal discharge.     Physical Exam Triage Vital Signs ED Triage Vitals  Enc Vitals Group     BP 12/12/22 1631 136/88     Pulse Rate 12/12/22 1631 67     Resp 12/12/22 1631 19     Temp 12/12/22 1631 (!) 97.3 F (36.3 C)     Temp Source 12/12/22 1631 Oral     SpO2 12/12/22 1631 99 %     Weight --      Height --      Head Circumference --      Peak Flow --      Pain Score 12/12/22 1630 3     Pain Loc --      Pain Edu? --      Excl. in GC? --    No data  found.  Updated Vital Signs BP 136/88 (BP Location: Right Arm)   Pulse 67   Temp (!) 97.3 F (36.3 C) (Oral)   Resp 19   LMP 12/10/2022 (Exact Date)   SpO2 99%   Visual Acuity Right Eye Distance:   Left Eye Distance:   Bilateral Distance:    Right Eye Near:   Left Eye Near:    Bilateral Near:     Physical Exam Vitals and nursing note reviewed.  Constitutional:      Appearance: Normal appearance.  HENT:     Head: Normocephalic and atraumatic.  Eyes:     Pupils: Pupils are equal, round, and reactive to light.  Cardiovascular:     Rate and Rhythm: Normal rate.  Pulmonary:     Effort: Pulmonary effort is normal.  Abdominal:     Tenderness: There is no right CVA tenderness or left CVA tenderness.  Skin:    General: Skin is warm and dry.  Neurological:     General: No focal deficit present.     Mental Status: She is alert and oriented to person, place, and time.  Psychiatric:        Mood and Affect: Mood normal.        Behavior: Behavior normal.      UC Treatments / Results  Labs (all labs ordered are listed, but only abnormal results are displayed) Labs Reviewed  POCT URINALYSIS DIP (MANUAL ENTRY) - Abnormal; Notable for the following components:      Result Value   Color, UA orange (*)    Clarity, UA turbid (*)    Blood, UA large (*)    Protein Ur, POC =30 (*)    Nitrite, UA Positive (*)    Leukocytes, UA Small (1+) (*)    All other components within normal limits  URINE CULTURE  POCT URINE PREGNANCY    EKG   Radiology No results found.  Procedures Procedures (including critical care time)  Medications Ordered in UC Medications - No data to display  Initial Impression / Assessment and Plan / UC Course  I have reviewed the triage vital signs and the nursing notes.  Pertinent labs & imaging results that were available during my care of the patient were reviewed by me and considered in  my medical decision making (see chart for details).      Reviewed exam and UA with patient.  UA positive for UTI.  Will culture and start Keflex Confirmed with Dr. Leonides Grills that we do not test for Ureaplasma.  Referred patient back to her gynecologist or to the health department for additional testing.  Patient verbalized understanding of this Patient declines any other STD testing at this time. Rest and fluids PCP follow-up if symptoms do not improve ER precautions reviewed and patient verbalized understanding Final Clinical Impressions(s) / UC Diagnoses   Final diagnoses:  Acute cystitis with hematuria     Discharge Instructions      Keflex twice daily for 7 days.  The clinic will contact you with results of the urine culture done today Follow-up with your gynecologist or the health department for any additional testing for Ureaplasma Please go to the ER if you have Any worsening symptoms    ED Prescriptions     Medication Sig Dispense Auth. Provider   cephALEXin (KEFLEX) 500 MG capsule Take 1 capsule (500 mg total) by mouth 2 (two) times daily for 7 days. 14 capsule Radford Pax, NP      PDMP not reviewed this encounter.   Radford Pax, NP 12/12/22 1719

## 2022-12-12 NOTE — ED Triage Notes (Signed)
Pt presents with c/o urinary frequency. States she has UTI's after sexual intercourse. Sometimes has brown discharge.   Denies abd and back pain.

## 2022-12-13 ENCOUNTER — Other Ambulatory Visit: Payer: Medicaid Other

## 2022-12-14 LAB — URINE CULTURE: Culture: >=100000 — AB

## 2022-12-26 ENCOUNTER — Other Ambulatory Visit: Payer: Medicaid Other

## 2022-12-29 ENCOUNTER — Encounter (HOSPITAL_BASED_OUTPATIENT_CLINIC_OR_DEPARTMENT_OTHER): Payer: Self-pay

## 2022-12-29 ENCOUNTER — Emergency Department (HOSPITAL_BASED_OUTPATIENT_CLINIC_OR_DEPARTMENT_OTHER): Payer: Medicaid Other

## 2022-12-29 ENCOUNTER — Emergency Department (HOSPITAL_BASED_OUTPATIENT_CLINIC_OR_DEPARTMENT_OTHER)
Admission: EM | Admit: 2022-12-29 | Discharge: 2022-12-29 | Disposition: A | Discharge status: Home / Self Care | Payer: Medicaid Other | Attending: Emergency Medicine | Admitting: Emergency Medicine

## 2022-12-29 ENCOUNTER — Other Ambulatory Visit: Payer: Self-pay

## 2022-12-29 DIAGNOSIS — R109 Unspecified abdominal pain: Secondary | ICD-10-CM | POA: Diagnosis present

## 2022-12-29 DIAGNOSIS — R1013 Epigastric pain: Secondary | ICD-10-CM | POA: Diagnosis not present

## 2022-12-29 DIAGNOSIS — R112 Nausea with vomiting, unspecified: Secondary | ICD-10-CM | POA: Diagnosis not present

## 2022-12-29 DIAGNOSIS — R100 Acute abdomen: Secondary | ICD-10-CM | POA: Diagnosis not present

## 2022-12-29 DIAGNOSIS — N3000 Acute cystitis without hematuria: Secondary | ICD-10-CM | POA: Diagnosis not present

## 2022-12-29 LAB — COMPREHENSIVE METABOLIC PANEL
ALT: 12 U/L (ref 0–44)
AST: 16 U/L (ref 15–41)
Albumin: 4.5 g/dL (ref 3.5–5.0)
Alkaline Phosphatase: 64 U/L (ref 38–126)
Anion gap: 10 (ref 5–15)
BUN: 11 mg/dL (ref 6–20)
CO2: 26 mmol/L (ref 22–32)
Calcium: 9.3 mg/dL (ref 8.9–10.3)
Chloride: 103 mmol/L (ref 98–111)
Creatinine, Ser: 0.75 mg/dL (ref 0.44–1.00)
GFR, Estimated: >60 mL/min (ref >60)
Glucose, Bld: 83 mg/dL (ref 70–99)
Potassium: 3.1 mmol/L — ABNORMAL LOW (ref 3.5–5.1)
Sodium: 139 mmol/L (ref 135–145)
Total Bilirubin: 1 mg/dL (ref 0.3–1.2)
Total Protein: 7.8 g/dL (ref 6.5–8.1)

## 2022-12-29 LAB — CBC
HCT: 42 % (ref 36.0–46.0)
Hemoglobin: 14.2 g/dL (ref 12.0–15.0)
MCH: 30 pg (ref 26.0–34.0)
MCHC: 33.8 g/dL (ref 30.0–36.0)
MCV: 88.6 fL (ref 80.0–100.0)
Platelets: 239 10*3/uL (ref 150–400)
RBC: 4.74 MIL/uL (ref 3.87–5.11)
RDW: 13.4 % (ref 11.5–15.5)
WBC: 6 10*3/uL (ref 4.0–10.5)
nRBC: 0 % (ref 0.0–0.2)

## 2022-12-29 LAB — URINALYSIS, ROUTINE W REFLEX MICROSCOPIC
Bilirubin Urine: NEGATIVE
Glucose, UA: NEGATIVE mg/dL
Hgb urine dipstick: NEGATIVE
Ketones, ur: NEGATIVE mg/dL
Nitrite: NEGATIVE
Protein, ur: NEGATIVE mg/dL
Specific Gravity, Urine: 1.02 (ref 1.005–1.030)
pH: 6 (ref 5.0–8.0)

## 2022-12-29 LAB — URINALYSIS, MICROSCOPIC (REFLEX)
RBC / HPF: NONE SEEN RBC/hpf (ref 0–5)
WBC, UA: >50 WBC/hpf (ref 0–5)

## 2022-12-29 LAB — LIPASE, BLOOD: Lipase: 31 U/L (ref 11–51)

## 2022-12-29 LAB — PREGNANCY, URINE: Preg Test, Ur: NEGATIVE

## 2022-12-29 MED ORDER — IOHEXOL 300 MG/ML  SOLN
100.0000 mL | Freq: Once | INTRAMUSCULAR | Status: AC | PRN
  Administered 2022-12-29: 100 mL via INTRAVENOUS

## 2022-12-29 MED ORDER — SODIUM CHLORIDE 0.9 % IV BOLUS
1000.0000 mL | Freq: Once | INTRAVENOUS | Status: AC
  Administered 2022-12-29: 1000 mL via INTRAVENOUS

## 2022-12-29 MED ORDER — ONDANSETRON 4 MG PO TBDP: 4.0000 mg | ORAL_TABLET | ORAL | 0 refills | Status: DC | PRN

## 2022-12-29 MED ORDER — CEFUROXIME AXETIL 500 MG PO TABS: 500.0000 mg | ORAL_TABLET | Freq: Two times a day (BID) | ORAL | 0 refills | Status: DC

## 2022-12-29 MED ORDER — ALUM & MAG HYDROXIDE-SIMETH 200-200-20 MG/5ML PO SUSP
30.0000 mL | Freq: Once | ORAL | Status: AC
  Administered 2022-12-29: 30 mL via ORAL
  Filled 2022-12-29: qty 30

## 2022-12-29 MED ORDER — SODIUM CHLORIDE 0.9 % IV SOLN
1.0000 g | Freq: Once | INTRAVENOUS | Status: AC
  Administered 2022-12-29: 1 g via INTRAVENOUS
  Filled 2022-12-29: qty 10

## 2022-12-29 MED ORDER — ONDANSETRON HCL 4 MG/2ML IJ SOLN
4.0000 mg | Freq: Once | INTRAMUSCULAR | Status: AC
  Administered 2022-12-29: 4 mg via INTRAVENOUS
  Filled 2022-12-29: qty 2

## 2022-12-29 MED ORDER — OMEPRAZOLE 20 MG PO CPDR: 20.0000 mg | DELAYED_RELEASE_CAPSULE | Freq: Every day | ORAL | 0 refills | Status: AC

## 2022-12-29 MED ORDER — PANTOPRAZOLE SODIUM 40 MG IV SOLR
40.0000 mg | Freq: Once | INTRAVENOUS | Status: AC
  Administered 2022-12-29: 40 mg via INTRAVENOUS
  Filled 2022-12-29: qty 10

## 2022-12-29 NOTE — ED Triage Notes (Signed)
She c/o upper abd. Pain with a few episodes of emesis.

## 2022-12-29 NOTE — ED Provider Notes (Signed)
[facility] [place] [facility] Provider Note   CSN: [id number] Arrival date & time: 12/29/22  1605     History  Chief Complaint  Patient presents with   Abdominal Pain    Vanessa Zavala is a 27 y.o. female.  HPI Patient reports she started getting abdominal pain yesterday.  At first she thought it was because the waist of her jeans was too tight and it was making her get bloated and gassy.  She on that her jeans and tried a lot of Pepto-Bismol and Gas-X.  She reports the pain continued and then she started having vomiting yesterday evening.  She reports she was vomiting green stuff with black looking chunks in it.  Vomiting has stopped but she continues to have discomfort and pain in her central and upper abdomen.  Patient reports that she was concerned because her mother has had gallbladder disease and had to have that removed.  Patient reports that she herself has had problems recently with getting bloating and feeling full with eating.  She reports certain foods will give her a lot of burning in her central abdomen and make her feel extremely gassy.  No fevers no chills.  No chest pain no cough no shortness of breath.  Patient reports she has been having burning with urination.  She reports that she has had frequent urinary tract infections and she was diagnosed with what the beginning of the month and she does not feel like it resolved.  She took a course of Keflex but still felt symptomatic and took about 5 days of nitrofurantoin that she had gotten from a prior prescription.  Nonetheless, she reports she continues to have burning with urination.  She reports she has had a history of a kidney infection previously.    Home Medications Prior to Admission medications   Medication Sig Start Date End Date Taking? Authorizing Provider  cefUROXime (CEFTIN) 500 MG tablet Take 1 tablet (500 mg total) by mouth 2 (two) times daily with a meal. 12/29/22  Yes Aquarius Latouche,  Lebron Conners, MD  omeprazole (PRILOSEC) 20 MG capsule Take 1 capsule (20 mg total) by mouth daily. 12/29/22  Yes Arby Barrette, MD  ondansetron (ZOFRAN-ODT) 4 MG disintegrating tablet Take 1 tablet (4 mg total) by mouth every 4 (four) hours as needed for nausea or vomiting. 12/29/22  Yes Maylon Sailors, Lebron Conners, MD  amphetamine-dextroamphetamine (ADDERALL) 20 MG tablet Take 1 tablet (20 mg total) by mouth 2 (two) times daily. 11/14/22   Melida Quitter, PA  amphetamine-dextroamphetamine (ADDERALL) 20 MG tablet Take 1 tablet (20 mg total) by mouth 2 (two) times daily. 12/12/22   Melida Quitter, PA  amphetamine-dextroamphetamine (ADDERALL) 20 MG tablet Take 1 tablet (20 mg total) by mouth 2 (two) times daily. 01/08/23   Melida Quitter, PA  levothyroxine (SYNTHROID) 112 MCG tablet Take 1 tablet (112 mcg total) by mouth daily before breakfast. 11/14/22   Melida Quitter, PA      Allergies    Patient has no known allergies.    Review of Systems   Review of Systems  Physical Exam Updated Vital Signs BP 115/81   Pulse 68   Temp 98 F (36.7 C) (Oral)   Resp 18   LMP 12/10/2022 (Exact Date)   SpO2 100%  Physical Exam Constitutional:      Comments: Alert nontoxic.  Mental status clear.  HENT:     Mouth/Throat:     Pharynx: Oropharynx is clear.  Eyes:  Extraocular Movements: Extraocular movements intact.  Cardiovascular:     Rate and Rhythm: Normal rate and regular rhythm.  Pulmonary:     Effort: Pulmonary effort is normal.     Breath sounds: Normal breath sounds.  Abdominal:     Comments: Moderate epigastric and upper quadrant tenderness.  Lower abdomen nontender.  Musculoskeletal:        General: No swelling or tenderness. Normal range of motion.     Right lower leg: No edema.     Left lower leg: No edema.  Skin:    General: Skin is warm and dry.  Neurological:     General: No focal deficit present.     Mental Status: She is oriented to person, place, and time.     Motor: No weakness.      Coordination: Coordination normal.  Psychiatric:        Mood and Affect: Mood normal.     ED Results / Procedures / Treatments   Labs (all labs ordered are listed, but only abnormal results are displayed) Labs Reviewed  COMPREHENSIVE METABOLIC PANEL - Abnormal; Notable for the following components:      Result Value   Potassium 3.1 (*)    All other components within normal limits  URINALYSIS, ROUTINE W REFLEX MICROSCOPIC - Abnormal; Notable for the following components:   APPearance CLOUDY (*)    Leukocytes,Ua SMALL (*)    All other components within normal limits  URINALYSIS, MICROSCOPIC (REFLEX) - Abnormal; Notable for the following components:   Bacteria, UA MANY (*)    All other components within normal limits  LIPASE, BLOOD  CBC  PREGNANCY, URINE    EKG None  Radiology CT ABDOMEN PELVIS W CONTRAST  Result Date: 12/29/2022 CLINICAL DATA:  Abdominal pain, acute, nonlocalized. Upper abd. Pain with a few episodes of emesis EXAM: CT ABDOMEN AND PELVIS WITH CONTRAST TECHNIQUE: Multidetector CT imaging of the abdomen and pelvis was performed using the standard protocol following bolus administration of intravenous contrast. RADIATION DOSE REDUCTION: This exam was performed according to the departmental dose-optimization program which includes automated exposure control, adjustment of the mA and/or kV according to patient size and/or use of iterative reconstruction technique. CONTRAST:  OMNIPAQUE IOHEXOL 300 MG/ML  SOLN COMPARISON:  CT abdomen pelvis 04/08/2014 FINDINGS: Lower chest: No acute abnormality. Hepatobiliary: No focal liver abnormality. No gallstones, gallbladder wall thickening, or pericholecystic fluid. No biliary dilatation. Pancreas: No focal lesion. Normal pancreatic contour. No surrounding inflammatory changes. No main pancreatic ductal dilatation. Spleen: Normal in size without focal abnormality. Adrenals/Urinary Tract: No adrenal nodule bilaterally. Bilateral  kidneys enhance symmetrically. No hydronephrosis. No hydroureter. The urinary bladder is unremarkable. Stomach/Bowel: Gastric lumen distended with ingested material. Stomach is within normal limits. No evidence of bowel wall thickening or dilatation. No pneumatosis. Appendix appears normal. Vascular/Lymphatic: No abdominal aorta or iliac aneurysm. No abdominal, pelvic, or inguinal lymphadenopathy. Reproductive: Retroverted uterus. Uterus and bilateral adnexa are unremarkable. Other: No intraperitoneal free fluid. No intraperitoneal free gas. No organized fluid collection. Musculoskeletal: No abdominal wall hernia or abnormality. No suspicious lytic or blastic osseous lesions. No acute displaced fracture. Multilevel degenerative changes of the spine. IMPRESSION: No acute intra-abdominal or intrapelvic abnormality. Electronically Signed   By: Tish Frederickson M.D.   On: 12/29/2022 19:43    Procedures Procedures    Medications Ordered in ED Medications  sodium chloride 0.9 % bolus 1,000 mL (0 mLs Intravenous Stopped 12/29/22 2033)  pantoprazole (PROTONIX) injection 40 mg (40 mg Intravenous Given 12/29/22 1931)  ondansetron [facility]) injection 4 mg (4 mg Intravenous Given 12/29/22 1934)  cefTRIAXone (ROCEPHIN) 1 g in sodium chloride 0.9 % 100 mL IVPB (0 g Intravenous Stopped 12/29/22 2033)  iohexol (OMNIPAQUE) 300 MG/ML solution 100 mL (100 mLs Intravenous Contrast Given 12/29/22 1924)  alum & mag hydroxide-simeth (MAALOX/MYLANTA) 200-200-20 MG/5ML suspension 30 mL (30 mLs Oral Given 12/29/22 2033)    ED Course/ Medical Decision Making/ A&P                             Medical Decision Making Amount and/or Complexity of Data Reviewed Labs: ordered. Radiology: ordered.  Risk OTC drugs. Prescription drug management.   Patient presents with combination of upper abdominal pain, vomiting and dysuria.  Clinically she is nontoxic.  Vital signs are normal.  At this time does not have septic appearance.   Will proceed with lab work as well as CT scan abdomen pelvis.  Differential diagnosis includes pyelonephritis\kidney stone\biliary colic cholecystitis\GERD gastritis\bowel obstruction.  Urinalysis grossly positive with greater than 50 WBCs and many bacteria good-quality specimen.  Will administer Rocephin 1 g IV.  Pregnancy test negative.  Lipase 31.  White count normal at 6.0 H&H normal at 14 and 42 metabolic panel with LFTs normal except potassium 3.1.  CT scan interpreted by radiology no acute findings.  At this time have higher suspicion for gastritis\peptic ulcer disease\GERD\acalculus biliary colic.  Patient is clinically well in appearance.  Plan will be to start treatment with daily PPI and dietary recommendations.  I have reviewed with the patient the necessity of close follow-up with her PCP and referral to gastroenterology for likely upper endoscopy.  Also consideration for HIDA scan.  At this time labs do not indicate cholecystitis and CT is not showing any inflammatory changes.  Patient has had recurrent UTIs.  He also describes having had a urethral stricture and having seen urology a couple years ago.  I advised her is very important that she gets repeat evaluation due to recurrent urinary tract infections.  Last reviewed culture shows a pansensitive E. coli.  Patient was given 1 g of Rocephin and will be treated with Ceftin.  Patient voices understanding.  I reviewed all of the plan and home management.  She is clinically well in appearance at this time stable for discharge.        Final Clinical Impression(s) / ED Diagnoses Final diagnoses:  Epigastric pain  Nausea and vomiting, unspecified vomiting type  Acute cystitis without hematuria    Rx / DC Orders ED Discharge Orders          Ordered    omeprazole (PRILOSEC) 20 MG capsule  Daily        12/29/22 2116    ondansetron (ZOFRAN-ODT) 4 MG disintegrating tablet  Every 4 hours PRN        12/29/22 2116    cefUROXime  (CEFTIN) 500 MG tablet  2 times daily with meals        12/29/22 2116              Arby Barrette, MD 12/29/22 2122

## 2022-12-29 NOTE — Discharge Instructions (Signed)
1.  You will need close follow-up with your doctor.  The fact that you are getting recurrent urinary tract infections should be addressed either with the infectious disease specialist or with gynecologist\urologist.  You have been given 1 dose of Rocephin in the emergency department.  This is an IV antibiotic that will be good for 24 hours.  Start Ceftin as prescribed tomorrow evening. 2.  You have had problems with recurrent upper abdominal pain and bloating.  You will also need further evaluation for this to rule out gastritis or ulcer.  Discussed referral to gastroenterology with your doctor.  At this time start taking omeprazole daily in the morning.  Take it about 30 minutes before you eat anything.  Review instructions about gastritis and GERD in your discharge instructions. 3.  Return to the emergency department if you are having new worsening or concerning symptoms.

## 2022-12-31 ENCOUNTER — Telehealth: Payer: Self-pay

## 2022-12-31 ENCOUNTER — Other Ambulatory Visit: Payer: Self-pay | Admitting: Nurse Practitioner

## 2022-12-31 DIAGNOSIS — N390 Urinary tract infection, site not specified: Secondary | ICD-10-CM

## 2022-12-31 NOTE — Telephone Encounter (Signed)
Pt calling to get a referral to Urology. She was advised at the ED that she should go see URO since she keeps getting UTIs.   Pt is requesting to go to  [facility] Address: [address], [place], [place] [address] Number [phone]

## 2022-12-31 NOTE — Telephone Encounter (Signed)
Referral has been placed to [facility]

## 2022-12-31 NOTE — Telephone Encounter (Signed)
Pt is aware and will wait on them to call to get her scheduled

## 2023-01-02 ENCOUNTER — Other Ambulatory Visit: Payer: Self-pay | Admitting: Family Medicine

## 2023-01-02 DIAGNOSIS — F988 Other specified behavioral and emotional disorders with onset usually occurring in childhood and adolescence: Secondary | ICD-10-CM

## 2023-01-03 ENCOUNTER — Other Ambulatory Visit: Payer: Self-pay | Admitting: Nurse Practitioner

## 2023-01-03 DIAGNOSIS — E063 Autoimmune thyroiditis: Secondary | ICD-10-CM

## 2023-01-03 DIAGNOSIS — F988 Other specified behavioral and emotional disorders with onset usually occurring in childhood and adolescence: Secondary | ICD-10-CM

## 2023-01-03 MED ORDER — AMPHETAMINE-DEXTROAMPHETAMINE 20 MG PO TABS
20.0000 mg | ORAL_TABLET | Freq: Two times a day (BID) | ORAL | 0 refills | Status: DC
Start: 2023-01-03 — End: 2023-01-23

## 2023-01-03 MED ORDER — LEVOTHYROXINE SODIUM 112 MCG PO TABS: 112.0000 ug | ORAL_TABLET | Freq: Every day | ORAL | 3 refills | Status: DC

## 2023-01-03 NOTE — Telephone Encounter (Signed)
Can you let  Lacourtney know that I filled her adderall. I think she can't fill the levothyroxine because Lequita Halt filled it on 11/14/2022 and it has 3 refills remaining. She should have refills at the pharmacy. Thanks so much.   -HB

## 2023-01-10 ENCOUNTER — Other Ambulatory Visit: Payer: Self-pay

## 2023-01-10 DIAGNOSIS — E063 Autoimmune thyroiditis: Secondary | ICD-10-CM

## 2023-01-22 ENCOUNTER — Other Ambulatory Visit (INDEPENDENT_AMBULATORY_CARE_PROVIDER_SITE_OTHER): Payer: Medicaid Other

## 2023-01-22 DIAGNOSIS — E063 Autoimmune thyroiditis: Secondary | ICD-10-CM

## 2023-01-22 LAB — T4, FREE: Free T4: 0.44 ng/dL — ABNORMAL LOW (ref 0.60–1.60)

## 2023-01-22 LAB — TSH: TSH: 39.86 u[IU]/mL — ABNORMAL HIGH (ref 0.35–5.50)

## 2023-01-23 ENCOUNTER — Telehealth: Payer: Self-pay

## 2023-01-23 ENCOUNTER — Encounter: Payer: Self-pay | Admitting: Family Medicine

## 2023-01-23 ENCOUNTER — Ambulatory Visit: Payer: Medicaid Other | Admitting: Family Medicine

## 2023-01-23 ENCOUNTER — Ambulatory Visit (INDEPENDENT_AMBULATORY_CARE_PROVIDER_SITE_OTHER): Payer: Medicaid Other | Admitting: Family Medicine

## 2023-01-23 VITALS — BP 105/70 | HR 66 | Ht 70.0 in | Wt 158.8 lb

## 2023-01-23 DIAGNOSIS — E282 Polycystic ovarian syndrome: Secondary | ICD-10-CM | POA: Diagnosis not present

## 2023-01-23 DIAGNOSIS — F988 Other specified behavioral and emotional disorders with onset usually occurring in childhood and adolescence: Secondary | ICD-10-CM

## 2023-01-23 DIAGNOSIS — E063 Autoimmune thyroiditis: Secondary | ICD-10-CM

## 2023-01-23 MED ORDER — AMPHETAMINE-DEXTROAMPHETAMINE 20 MG PO TABS
20.0000 mg | ORAL_TABLET | Freq: Two times a day (BID) | ORAL | 0 refills | Status: DC
Start: 2023-04-08 — End: 2023-01-27

## 2023-01-23 MED ORDER — AMPHETAMINE-DEXTROAMPHETAMINE 20 MG PO TABS
20.0000 mg | ORAL_TABLET | Freq: Two times a day (BID) | ORAL | 0 refills | Status: AC
Start: 2023-03-09 — End: 2023-04-08

## 2023-01-23 MED ORDER — AMPHETAMINE-DEXTROAMPHETAMINE 20 MG PO TABS
20.0000 mg | ORAL_TABLET | Freq: Two times a day (BID) | ORAL | 0 refills | Status: DC
Start: 2023-02-07 — End: 2023-01-27

## 2023-01-23 MED ORDER — AMPHETAMINE-DEXTROAMPHETAMINE 20 MG PO TABS
20.0000 mg | ORAL_TABLET | Freq: Two times a day (BID) | ORAL | 0 refills | Status: DC
Start: 2023-02-07 — End: 2023-01-23

## 2023-01-23 MED ORDER — AMPHETAMINE-DEXTROAMPHETAMINE 20 MG PO TABS
20.0000 mg | ORAL_TABLET | Freq: Two times a day (BID) | ORAL | 0 refills | Status: DC
Start: 2023-04-08 — End: 2023-01-23

## 2023-01-23 MED ORDER — AMPHETAMINE-DEXTROAMPHETAMINE 20 MG PO TABS
20.0000 mg | ORAL_TABLET | Freq: Two times a day (BID) | ORAL | 0 refills | Status: DC
Start: 2023-03-09 — End: 2023-01-23

## 2023-01-23 NOTE — Assessment & Plan Note (Signed)
TSH and free T4 have improved since last check, most recently TSH 39.86 and free T40.44.  She has an upcoming appointment with endocrinology next week, I will let them make further adjustments given history of Hashimoto's.  If she prefers to transfer back to primary care once thyroid levels are stable in the future, this may be an option.

## 2023-01-23 NOTE — Progress Notes (Signed)
   Established Patient Office Visit  Subjective   Patient ID: Vanessa Zavala, female    DOB: December 12, 1995  Age: 27 y.o. MRN: [id number]  Chief Complaint  Patient presents with   Medical Management of Chronic Issues    HPI Vanessa Zavala is a 27 y.o. female presenting today for follow up of hypothyroidism, ADHD. Hypothyroidism: Taking levothyroxine 112 mcg regularly in the AM away from food and vitamins. Denies fatigue, weight changes, heat/cold intolerance, skin/hair changes, bowel changes, CVS symptoms. ADHD: Reports symptoms are well controlled on Adderall. Denies any problems with insomnia, chest pain, palpitations, or SOB.    ROS Negative unless otherwise noted in HPI   Objective:     BP 105/70   Pulse 66   Ht 5\' 10"  (1.778 m)   Wt 158 lb 12.8 oz (72 kg)   SpO2 100%   BMI 22.79 kg/m   Physical Exam Constitutional:      General: She is not in acute distress.    Appearance: Normal appearance.  HENT:     Head: Normocephalic and atraumatic.  Cardiovascular:     Rate and Rhythm: Normal rate and regular rhythm.     Heart sounds: No murmur heard.    No friction rub. No gallop.  Pulmonary:     Effort: Pulmonary effort is normal. No respiratory distress.     Breath sounds: No wheezing, rhonchi or rales.  Skin:    General: Skin is warm and dry.  Neurological:     Mental Status: She is alert and oriented to person, place, and time.      Assessment & Plan:  Hypothyroidism, acquired, autoimmune Assessment & Plan: TSH and free T4 have improved since last check, most recently TSH 39.86 and free T40.44.  She has an upcoming appointment with endocrinology next week, I will let them make further adjustments given history of Hashimoto's.  If she prefers to transfer back to primary care once thyroid levels are stable in the future, this may be an option.   Attention deficit disorder (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 Assessment & Plan: Stable, well-controlled.  Continue Adderall 20 mg twice  daily.  PMD P reviewed, no aberrancies.  Provided refills for the next 3 months.  Orders: -     Amphetamine-Dextroamphetamine; Take 1 tablet (20 mg total) by mouth 2 (two) times daily.  Dispense: 60 tablet; Refill: 0 -     Amphetamine-Dextroamphetamine; Take 1 tablet (20 mg total) by mouth 2 (two) times daily.  Dispense: 60 tablet; Refill: 0 -     Amphetamine-Dextroamphetamine; Take 1 tablet (20 mg total) by mouth 2 (two) times daily.  Dispense: 60 tablet; Refill: 0  PCOS (polycystic ovarian syndrome) Assessment & Plan: History of PCOS diagnosed prior to establishing care at our office in 2022.  Prior to her pregnancy with her son, she tried birth control patches but did not find that they helped to regulate her menstrual cycles.  She has a new partner right now and they are discussing conceiving a child in the future.  She has an appointment with OB/GYN on 01/26/2023.  We discussed that they may offer options such as metformin or Clomid, but they are going to be the best place to receive preconception counseling.     Return in about 3 months (around 05/05/2023) for annual physical, fasting blood work 1 week before.    Vanessa Quitter, PA

## 2023-01-23 NOTE — Addendum Note (Signed)
Addended by: Saralyn Pilar on: 01/23/2023 11:20 AM   Modules accepted: Orders

## 2023-01-23 NOTE — Assessment & Plan Note (Signed)
Stable, well-controlled.  Continue Adderall 20 mg twice daily.  PMD P reviewed, no aberrancies.  Provided refills for the next 3 months.

## 2023-01-23 NOTE — Telephone Encounter (Signed)
Pt is changing pharmacies to close to where she lives. Pt is now using [facility] [place] and is asking that the following prescriptions be switched: All 3 of the amphetamine-dextroamphetamine (ADDERALL) 20 MG tablet  levothyroxine (SYNTHROID) 112 MCG tablet   Pharmacy: [id number] - [place], [place] - [address]

## 2023-01-23 NOTE — Telephone Encounter (Signed)
I changed the Adderall prescriptions to the requested pharmacy.  I will wait on sending any thyroid medication in and let the endocrinologist make adjustments as needed.  I did take away [place] as one of her pharmacies, so endocrinology [facility] [place] [facility].

## 2023-01-23 NOTE — Assessment & Plan Note (Signed)
History of PCOS diagnosed prior to establishing care at our office in 2022.  Prior to her pregnancy with her son, she tried birth control patches but did not find that they helped to regulate her menstrual cycles.  She has a new partner right now and they are discussing conceiving a child in the future.  She has an appointment with OB/GYN on 01/26/2023.  We discussed that they may offer options such as metformin or Clomid, but they are going to be the best place to receive preconception counseling.

## 2023-01-27 ENCOUNTER — Other Ambulatory Visit: Payer: Self-pay | Admitting: Family Medicine

## 2023-01-27 DIAGNOSIS — F988 Other specified behavioral and emotional disorders with onset usually occurring in childhood and adolescence: Secondary | ICD-10-CM

## 2023-01-27 DIAGNOSIS — Z8619 Personal history of other infectious and parasitic diseases: Secondary | ICD-10-CM | POA: Diagnosis not present

## 2023-01-27 DIAGNOSIS — Z0000 Encounter for general adult medical examination without abnormal findings: Secondary | ICD-10-CM | POA: Diagnosis not present

## 2023-01-27 DIAGNOSIS — E282 Polycystic ovarian syndrome: Secondary | ICD-10-CM | POA: Diagnosis not present

## 2023-01-27 MED ORDER — AMPHETAMINE-DEXTROAMPHETAMINE 20 MG PO TABS
20.0000 mg | ORAL_TABLET | Freq: Two times a day (BID) | ORAL | 0 refills | Status: DC
Start: 2023-01-29 — End: 2023-02-28

## 2023-01-27 MED ORDER — AMPHETAMINE-DEXTROAMPHETAMINE 20 MG PO TABS
20.0000 mg | ORAL_TABLET | Freq: Two times a day (BID) | ORAL | 0 refills | Status: DC
Start: 2023-03-28 — End: 2023-02-28

## 2023-01-27 MED ORDER — AMPHETAMINE-DEXTROAMPHETAMINE 20 MG PO TABS
20.0000 mg | ORAL_TABLET | Freq: Two times a day (BID) | ORAL | 0 refills | Status: DC
Start: 2023-02-27 — End: 2023-02-28

## 2023-01-30 ENCOUNTER — Ambulatory Visit: Payer: Medicaid Other | Admitting: "Endocrinology

## 2023-01-31 ENCOUNTER — Ambulatory Visit: Payer: Medicaid Other | Admitting: "Endocrinology

## 2023-02-14 ENCOUNTER — Ambulatory Visit: Payer: Medicaid Other | Admitting: Family Medicine

## 2023-02-28 ENCOUNTER — Telehealth: Payer: Self-pay | Admitting: *Deleted

## 2023-02-28 DIAGNOSIS — F988 Other specified behavioral and emotional disorders with onset usually occurring in childhood and adolescence: Secondary | ICD-10-CM

## 2023-02-28 MED ORDER — AMPHETAMINE-DEXTROAMPHETAMINE 20 MG PO TABS
20.0000 mg | ORAL_TABLET | Freq: Two times a day (BID) | ORAL | 0 refills | Status: DC
Start: 2023-02-28 — End: 2023-04-21

## 2023-02-28 MED ORDER — AMPHETAMINE-DEXTROAMPHETAMINE 20 MG PO TABS
20.0000 mg | ORAL_TABLET | Freq: Two times a day (BID) | ORAL | 0 refills | Status: DC
Start: 2023-03-28 — End: 2023-04-23

## 2023-02-28 NOTE — Telephone Encounter (Signed)
Pt calling stating that she needs her adderall Rx sent back to [place] due to cost purpose.  [facility], please send them all back to [place]

## 2023-02-28 NOTE — Telephone Encounter (Signed)
Sent to [place]

## 2023-03-18 DIAGNOSIS — R87810 Cervical high risk human papillomavirus (HPV) DNA test positive: Secondary | ICD-10-CM | POA: Diagnosis not present

## 2023-03-18 DIAGNOSIS — B977 Papillomavirus as the cause of diseases classified elsewhere: Secondary | ICD-10-CM | POA: Diagnosis not present

## 2023-03-18 DIAGNOSIS — N72 Inflammatory disease of cervix uteri: Secondary | ICD-10-CM | POA: Diagnosis not present

## 2023-04-01 ENCOUNTER — Ambulatory Visit: Payer: Medicaid Other | Admitting: "Endocrinology

## 2023-04-17 ENCOUNTER — Other Ambulatory Visit: Payer: Self-pay

## 2023-04-17 DIAGNOSIS — Z0000 Encounter for general adult medical examination without abnormal findings: Secondary | ICD-10-CM

## 2023-04-17 DIAGNOSIS — R7301 Impaired fasting glucose: Secondary | ICD-10-CM

## 2023-04-17 DIAGNOSIS — E063 Autoimmune thyroiditis: Secondary | ICD-10-CM

## 2023-04-17 DIAGNOSIS — E663 Overweight: Secondary | ICD-10-CM

## 2023-04-21 ENCOUNTER — Encounter: Payer: Self-pay | Admitting: "Endocrinology

## 2023-04-21 ENCOUNTER — Ambulatory Visit (INDEPENDENT_AMBULATORY_CARE_PROVIDER_SITE_OTHER): Payer: Medicaid Other | Admitting: "Endocrinology

## 2023-04-21 VITALS — BP 120/73 | HR 82 | Ht 70.0 in | Wt 167.4 lb

## 2023-04-21 DIAGNOSIS — E039 Hypothyroidism, unspecified: Secondary | ICD-10-CM | POA: Diagnosis not present

## 2023-04-21 LAB — T4, FREE: Free T4: 0.88 ng/dL (ref 0.60–1.60)

## 2023-04-21 LAB — TSH: TSH: 13.05 u[IU]/mL — ABNORMAL HIGH (ref 0.35–5.50)

## 2023-04-21 NOTE — Progress Notes (Signed)
Outpatient Endocrinology Note [place], [place]  04/21/23   Vanessa Zavala April 09, 1996 [id number]  Referring Provider: Melida Quitter, PA Primary Care Provider: Melida Quitter, PA Subjective  No chief complaint on file.   Assessment & Plan  Diagnoses and all orders for this visit:  Acquired hypothyroidism -     Cancel: T4, free; Future -     TSH; Future -     Cancel: TSH; Future -     T4, free; Future -     T4, free -     TSH -     T4, free; Future -     TSH; Future   Vanessa Zavala is currently taking levothyroxine 112 mcg qd regularly since 2-3 weeks. Patient is currently biochemically hypothyroid.  Educated on thyroid axis.  Recommend the following: Take levothyroxine 112 every morning. Will adjust dose based on today's baseline labs.  Advised to take levothyroxine first thing in the morning on empty stomach and wait at least 30 minutes to 1 hour before eating or drinking anything or taking any other medications. Space out levothyroxine by 4 hours from any acid reflux medication/fibrate/iron/calcium/multivitamin. Advised to take birth control pills and nutritional supplements in the evening. Repeat lab before next visit or sooner if symptoms of hyperthyroidism or hypothyroidism develop.  Notify us immediately in case of pregnancy/breastfeeding or significant weight gain or loss. Counseled on compliance and follow up needs.  I have reviewed current medications, nurse's notes, allergies, vital signs, past medical and surgical history, family medical history, and social history for this encounter. Counseled patient on symptoms, examination findings, lab findings, imaging results, treatment decisions and monitoring and prognosis. The patient understood the recommendations and agrees with the treatment plan. All questions regarding treatment plan were fully answered.   Return in about 3 months (around 07/22/2023) for visit, labs today, labs before next  visit.   Altamese Kay, MD  04/21/23   I have reviewed current medications, nurse's notes, allergies, vital signs, past medical and surgical history, family medical history, and social history for this encounter. Counseled patient on symptoms, examination findings, lab findings, imaging results, treatment decisions and monitoring and prognosis. The patient understood the recommendations and agrees with the treatment plan. All questions regarding treatment plan were fully answered.   History of Present Illness Vanessa Zavala is a 27 y.o. year old female who presents to our clinic in hypothyroidism.  Seen by Dr Everardo All in past, per records, patient returns for f/u of hypothyroidism (dx'ed in 2012; he has been on synthroid since then; [place] in 2016 showed heterogeneous tissue, but no nodule).   Currently taking levothyroxine 112 mcg qd regularly since 2-3 weeks.  Symptoms suggestive of HYPOTHYROIDISM:  fatigue Yes weight gain Yes cold intolerance  Yes constipation  Yes  Compressive symptoms:  dysphagia  No dysphonia  Yes, deeper since 2024 positional dyspnea (especially with simultaneous arms elevation)  No  Smokes  No On biotin  No Personal history of head/neck surgery/irradiation  No  Physical Exam  BP 120/73   Pulse 82   Ht 5\' 10"  (1.778 m)   Wt 167 lb 6.4 oz (75.9 kg)   SpO2 99%   BMI 24.02 kg/m  Constitutional: well developed, well nourished Head: normocephalic, atraumatic, no exophthalmos Eyes: sclera anicteric, no redness Neck: no thyromegaly, no thyroid tenderness; no nodules palpated Lungs: normal respiratory effort Neurology: alert and oriented, no fine hand tremor Skin: dry, no appreciable rashes Musculoskeletal: no appreciable defects Psychiatric: normal  mood and affect  Allergies No Known Allergies  Current Medications Patient's Medications  New Prescriptions   No medications on file  Previous Medications   AMPHETAMINE-DEXTROAMPHETAMINE (ADDERALL) 20  MG TABLET    Take 1 tablet (20 mg total) by mouth 2 (two) times daily.   DICYCLOMINE (BENTYL) 10 MG CAPSULE    Take 10 mg by mouth 4 (four) times daily -  before meals and at bedtime.   LEVOTHYROXINE (SYNTHROID) 112 MCG TABLET    Take 1 tablet (112 mcg total) by mouth daily before breakfast.   OMEPRAZOLE (PRILOSEC) 20 MG CAPSULE    Take 1 capsule (20 mg total) by mouth daily.  Modified Medications   No medications on file  Discontinued Medications   AMPHETAMINE-DEXTROAMPHETAMINE (ADDERALL) 20 MG TABLET    Take 1 tablet (20 mg total) by mouth 2 (two) times daily.   ONDANSETRON (ZOFRAN-ODT) 4 MG DISINTEGRATING TABLET    Take 1 tablet (4 mg total) by mouth every 4 (four) hours as needed for nausea or vomiting.    Past Medical History Past Medical History:  Diagnosis Date   Abdominal pain    Goiter    Hashimoto's disease    Hypothyroidism, acquired, autoimmune    Oligomenorrhea    PCOS (polycystic ovarian syndrome)    Thyroiditis, autoimmune     Past Surgical History Past Surgical History:  Procedure Laterality Date   NO PAST SURGERIES      Family History family history includes Cancer in her maternal grandmother; Diabetes in her maternal grandmother; Thyroid disease in her maternal grandmother and mother.  Social History Social History   Socioeconomic History   Marital status: Single    Spouse name: Not on file   Number of children: Not on file   Years of education: Not on file   Highest education level: Not on file  Occupational History   Not on file  Tobacco Use   Smoking status: Former    Types: E-cigarettes   Smokeless tobacco: Never  Vaping Use   Vaping status: Every Day  Substance and Sexual Activity   Alcohol use: Not Currently    Comment: occasionally   Drug use: Not Currently    Types: Marijuana   Sexual activity: Not Currently  Other Topics Concern   Not on file  Social History Narrative   Not on file   Social Determinants of Health   Financial  Resource Strain: Not on file  Food Insecurity: Not on file  Transportation Needs: Not on file  Physical Activity: Not on file  Stress: Not on file  Social Connections: Not on file  Intimate Partner Violence: Not on file    Laboratory Investigations Lab Results  Component Value Date   TSH 39.86 (H) 01/22/2023   TSH 85.400 (H) 11/12/2022   TSH 14.047 (H) 08/28/2022   FREET4 0.44 (L) 01/22/2023   FREET4 0.67 (L) 11/12/2022   FREET4 0.91 01/25/2022     No results found for: "TSI"   No components found for: "TRAB"   Lab Results  Component Value Date   CHOL 136 09/11/2022   Lab Results  Component Value Date   HDL 58 09/11/2022   Lab Results  Component Value Date   LDLCALC 65 09/11/2022   Lab Results  Component Value Date   TRIG 61 09/11/2022   Lab Results  Component Value Date   CHOLHDL 2.3 09/11/2022   Lab Results  Component Value Date   CREATININE 0.75 12/29/2022   No results found for: "  GFR"    Component Value Date/Time   NA 139 12/29/2022 1635   K 3.1 (L) 12/29/2022 1635   CL 103 12/29/2022 1635   CO2 26 12/29/2022 1635   GLUCOSE 83 12/29/2022 1635   BUN 11 12/29/2022 1635   CREATININE 0.75 12/29/2022 1635   CREATININE 0.79 05/15/2011 1445   CALCIUM 9.3 12/29/2022 1635   PROT 7.8 12/29/2022 1635   ALBUMIN 4.5 12/29/2022 1635   AST 16 12/29/2022 1635   ALT 12 12/29/2022 1635   ALKPHOS 64 12/29/2022 1635   BILITOT 1.0 12/29/2022 1635   GFRNONAA >60 12/29/2022 1635   GFRAA >60 01/19/2020 1846      Latest Ref Rng & Units 12/29/2022    4:35 PM 08/28/2022    1:46 PM 09/18/2020    2:32 PM  BMP  Glucose 70 - 99 mg/dL 83  88  99   BUN 6 - 20 mg/dL 11  11  9    Creatinine 0.44 - 1.00 mg/dL 3.08  6.57  8.46   Sodium 135 - 145 mmol/L 139  138  138   Potassium 3.5 - 5.1 mmol/L 3.1  3.4  3.7   Chloride 98 - 111 mmol/L 103  106  110   CO2 22 - 32 mmol/L 26  27  21    Calcium 8.9 - 10.3 mg/dL 9.3  8.9  9.6        Component Value Date/Time   WBC 6.0  12/29/2022 1635   RBC 4.74 12/29/2022 1635   HGB 14.2 12/29/2022 1635   HCT 42.0 12/29/2022 1635   PLT 239 12/29/2022 1635   MCV 88.6 12/29/2022 1635   MCH 30.0 12/29/2022 1635   MCHC 33.8 12/29/2022 1635   RDW 13.4 12/29/2022 1635   LYMPHSABS 2.2 08/28/2022 1346   MONOABS 0.4 08/28/2022 1346   EOSABS 0.2 08/28/2022 1346   BASOSABS 0.1 08/28/2022 1346      Parts of this note may have been dictated using voice recognition software. There may be variances in spelling and vocabulary which are unintentional. Not all errors are proofread. Please notify the Thereasa Parkin if any discrepancies are noted or if the meaning of any statement is not clear.

## 2023-04-23 ENCOUNTER — Telehealth: Payer: Self-pay

## 2023-04-23 ENCOUNTER — Telehealth: Payer: Self-pay | Admitting: "Endocrinology

## 2023-04-23 DIAGNOSIS — F988 Other specified behavioral and emotional disorders with onset usually occurring in childhood and adolescence: Secondary | ICD-10-CM

## 2023-04-23 DIAGNOSIS — E063 Autoimmune thyroiditis: Secondary | ICD-10-CM

## 2023-04-23 MED ORDER — AMPHETAMINE-DEXTROAMPHETAMINE 20 MG PO TABS: 20.0000 mg | ORAL_TABLET | Freq: Two times a day (BID) | ORAL | 0 refills | Status: DC

## 2023-04-23 MED ORDER — LEVOTHYROXINE SODIUM 112 MCG PO TABS: 112.0000 ug | ORAL_TABLET | Freq: Every day | ORAL | 3 refills | Status: DC
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
## 2023-04-23 NOTE — Telephone Encounter (Signed)
I sent in refills for both, she will not be able to fill the Adderall until 04/27/2023 as that is 30 days since her last fill

## 2023-04-23 NOTE — Telephone Encounter (Signed)
She is asking if it is okay to take her Levothyroxine with coffee, yes it is ok to take her levothyroxine with coffee just noting like orange juice or vitamin c

## 2023-04-23 NOTE — Telephone Encounter (Signed)
Patient wants to speak to clinical staff about her medication. Please call ASAP. [phone]

## 2023-04-28 ENCOUNTER — Other Ambulatory Visit: Payer: Medicaid Other

## 2023-04-28 NOTE — Addendum Note (Signed)
Addended by: Saralyn Pilar on: 04/28/2023 08:22 AM   Modules accepted: Orders

## 2023-04-30 ENCOUNTER — Other Ambulatory Visit: Payer: Medicaid Other

## 2023-04-30 DIAGNOSIS — Z0000 Encounter for general adult medical examination without abnormal findings: Secondary | ICD-10-CM

## 2023-04-30 DIAGNOSIS — E663 Overweight: Secondary | ICD-10-CM

## 2023-04-30 DIAGNOSIS — R7301 Impaired fasting glucose: Secondary | ICD-10-CM | POA: Diagnosis not present

## 2023-04-30 DIAGNOSIS — E063 Autoimmune thyroiditis: Secondary | ICD-10-CM | POA: Diagnosis not present

## 2023-05-01 DIAGNOSIS — M5412 Radiculopathy, cervical region: Secondary | ICD-10-CM | POA: Diagnosis not present

## 2023-05-01 DIAGNOSIS — M25512 Pain in left shoulder: Secondary | ICD-10-CM | POA: Diagnosis not present

## 2023-05-01 LAB — COMPREHENSIVE METABOLIC PANEL
ALT: 11 [IU]/L (ref 0–32)
AST: 15 [IU]/L (ref 0–40)
Albumin: 4.7 g/dL (ref 4.0–5.0)
Alkaline Phosphatase: 65 [IU]/L (ref 44–121)
BUN/Creatinine Ratio: 18 (ref 9–23)
BUN: 14 mg/dL (ref 6–20)
Bilirubin Total: 0.5 mg/dL (ref 0.0–1.2)
CO2: 22 mmol/L (ref 20–29)
Calcium: 9.3 mg/dL (ref 8.7–10.2)
Chloride: 105 mmol/L (ref 96–106)
Creatinine, Ser: 0.78 mg/dL (ref 0.57–1.00)
Globulin, Total: 2.1 g/dL (ref 1.5–4.5)
Glucose: 83 mg/dL (ref 70–99)
Potassium: 4.1 mmol/L (ref 3.5–5.2)
Sodium: 143 mmol/L (ref 134–144)
Total Protein: 6.8 g/dL (ref 6.0–8.5)
eGFR: 107 mL/min/{1.73_m2} (ref >59)

## 2023-05-01 LAB — CBC WITH DIFFERENTIAL/PLATELET
Basophils Absolute: 0 10*3/uL (ref 0.0–0.2)
Basos: 0 %
EOS (ABSOLUTE): 0.1 10*3/uL (ref 0.0–0.4)
Eos: 2 %
Hematocrit: 39.7 % (ref 34.0–46.6)
Hemoglobin: 13.4 g/dL (ref 11.1–15.9)
Immature Grans (Abs): 0 10*3/uL (ref 0.0–0.1)
Immature Granulocytes: 0 %
Lymphocytes Absolute: 1.5 10*3/uL (ref 0.7–3.1)
Lymphs: 23 %
MCH: 30.4 pg (ref 26.6–33.0)
MCHC: 33.8 g/dL (ref 31.5–35.7)
MCV: 90 fL (ref 79–97)
Monocytes Absolute: 0.4 10*3/uL (ref 0.1–0.9)
Monocytes: 7 %
Neutrophils Absolute: 4.5 10*3/uL (ref 1.4–7.0)
Neutrophils: 68 %
Platelets: 300 10*3/uL (ref 150–450)
RBC: 4.41 x10E6/uL (ref 3.77–5.28)
RDW: 12.8 % (ref 11.7–15.4)
WBC: 6.6 10*3/uL (ref 3.4–10.8)

## 2023-05-01 LAB — LIPID PANEL
Chol/HDL Ratio: 2.3 ratio (ref 0.0–4.4)
Cholesterol, Total: 156 mg/dL (ref 100–199)
HDL: 67 mg/dL (ref >39)
LDL Chol Calc (NIH): 74 mg/dL (ref 0–99)
Triglycerides: 78 mg/dL (ref 0–149)
VLDL Cholesterol Cal: 15 mg/dL (ref 5–40)

## 2023-05-01 LAB — HEMOGLOBIN A1C
Est. average glucose Bld gHb Est-mCnc: 97 mg/dL
Hgb A1c MFr Bld: 5 % (ref 4.8–5.6)

## 2023-05-01 LAB — T4, FREE: Free T4: 1.54 ng/dL (ref 0.82–1.77)

## 2023-05-05 ENCOUNTER — Encounter: Payer: Medicaid Other | Admitting: Family Medicine

## 2023-05-05 ENCOUNTER — Telehealth: Payer: Self-pay | Admitting: *Deleted

## 2023-05-05 NOTE — Telephone Encounter (Signed)
Pt calling to reschedule due to having to babysit and cannot bring both children to appt.

## 2023-05-27 ENCOUNTER — Other Ambulatory Visit: Payer: Self-pay | Admitting: Family Medicine

## 2023-05-27 DIAGNOSIS — F988 Other specified behavioral and emotional disorders with onset usually occurring in childhood and adolescence: Secondary | ICD-10-CM

## 2023-05-28 DIAGNOSIS — J029 Acute pharyngitis, unspecified: Secondary | ICD-10-CM | POA: Diagnosis not present

## 2023-05-28 MED ORDER — AMPHETAMINE-DEXTROAMPHETAMINE 20 MG PO TABS
20.0000 mg | ORAL_TABLET | Freq: Two times a day (BID) | ORAL | 0 refills | Status: DC
Start: 2023-05-28 — End: 2023-06-16

## 2023-05-28 NOTE — Telephone Encounter (Signed)
Pt called to have this request go to [place], I have changed the pharmacy

## 2023-06-03 DIAGNOSIS — J019 Acute sinusitis, unspecified: Secondary | ICD-10-CM | POA: Diagnosis not present

## 2023-06-03 DIAGNOSIS — R509 Fever, unspecified: Secondary | ICD-10-CM | POA: Diagnosis not present

## 2023-06-03 DIAGNOSIS — R5381 Other malaise: Secondary | ICD-10-CM | POA: Diagnosis not present

## 2023-06-13 DIAGNOSIS — N390 Urinary tract infection, site not specified: Secondary | ICD-10-CM | POA: Diagnosis not present

## 2023-06-13 DIAGNOSIS — N3000 Acute cystitis without hematuria: Secondary | ICD-10-CM | POA: Diagnosis not present

## 2023-06-13 DIAGNOSIS — N898 Other specified noninflammatory disorders of vagina: Secondary | ICD-10-CM | POA: Diagnosis not present

## 2023-06-13 DIAGNOSIS — N3001 Acute cystitis with hematuria: Secondary | ICD-10-CM | POA: Diagnosis not present

## 2023-06-16 ENCOUNTER — Ambulatory Visit (INDEPENDENT_AMBULATORY_CARE_PROVIDER_SITE_OTHER): Payer: Medicaid Other | Admitting: Family Medicine

## 2023-06-16 ENCOUNTER — Encounter: Payer: Self-pay | Admitting: Family Medicine

## 2023-06-16 VITALS — BP 123/81 | HR 64 | Resp 20 | Ht 70.0 in | Wt 170.0 lb

## 2023-06-16 DIAGNOSIS — Z124 Encounter for screening for malignant neoplasm of cervix: Secondary | ICD-10-CM | POA: Diagnosis not present

## 2023-06-16 DIAGNOSIS — E063 Autoimmune thyroiditis: Secondary | ICD-10-CM | POA: Diagnosis not present

## 2023-06-16 DIAGNOSIS — F988 Other specified behavioral and emotional disorders with onset usually occurring in childhood and adolescence: Secondary | ICD-10-CM

## 2023-06-16 MED ORDER — LEVOTHYROXINE SODIUM 112 MCG PO TABS
112.0000 ug | ORAL_TABLET | Freq: Every day | ORAL | 3 refills | Status: DC
Start: 2023-06-16 — End: 2024-02-03

## 2023-06-16 MED ORDER — AMPHETAMINE-DEXTROAMPHETAMINE 20 MG PO TABS: 20.0000 mg | ORAL_TABLET | Freq: Two times a day (BID) | ORAL | 0 refills | Status: DC

## 2023-06-16 NOTE — Assessment & Plan Note (Signed)
Stable, well-controlled.  Continue Adderall 20 mg twice daily.  PMDP reviewed, no aberrancies.  Overdose risk score 280.  Provided refills for the next 3 months.

## 2023-06-16 NOTE — Progress Notes (Signed)
Complete physical exam  Patient: Vanessa Zavala   DOB: 08/14/1995   27 y.o. Female  MRN: [id number]  Subjective:    Chief Complaint  Patient presents with   Annual Exam    Vanessa Zavala is a 27 y.o. female who presents today for a complete physical exam. She reports consuming a general diet.  She stays active and staying at home to care for her son.  She generally feels well.  She has been taking her levothyroxine regularly every day and is hopeful that at her upcoming appointment in January, her thyroid levels will be evened out.  She reports sleeping well. She does not have additional problems to discuss today.  She is requesting a slightly early refill of her Adderall and levothyroxine to pick up before she leaves for [place] in 2 days.   Most recent fall risk assessment:    11/02/2021    9:27 AM  Fall Risk   Falls in the past year? 0  Number falls in past yr: 0  Injury with Fall? 0  Risk for fall due to : No Fall Risks  Follow up Falls evaluation completed     Most recent depression and anxiety screenings:    01/23/2023    9:45 AM 11/14/2022    9:20 AM  PHQ 2/9 Scores  PHQ - 2 Score 0 0  PHQ- 9 Score 2 6      11/14/2022    9:21 AM 06/19/2022   10:24 AM 03/20/2022    2:55 PM 01/25/2022   10:00 AM  GAD 7 : Generalized Anxiety Score  Nervous, Anxious, on Edge 0 0 0 0  Control/stop worrying 0 0 0 0  Worry too much - different things 1 0 0 0  Trouble relaxing 0 0 0 0  Restless 0 0 0 0  Easily annoyed or irritable 0 0 0 0  Afraid - awful might happen 0 0 0 0  Total GAD 7 Score 1 0 0 0  Anxiety Difficulty Not difficult at all       Patient Active Problem List   Diagnosis Date Noted   Cervical neck pain with evidence of disc disease 07/08/2022   Acne vulgaris 02/10/2022   Raynaud's phenomenon without gangrene 02/10/2022   Grade IV hemorrhoids 10/25/2021   Overweight with body mass index (BMI) 25.0-29.9 07/25/2021   Body mass index 29.0-29.9, adult 04/26/2021    PCOS (polycystic ovarian syndrome) 03/27/2021   Attention deficit disorder (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 03/27/2021   Goiter    Hypothyroidism, acquired, autoimmune 11/29/2010    Past Surgical History:  Procedure Laterality Date   NO PAST SURGERIES     Social History   Tobacco Use   Smoking status: Former    Types: E-cigarettes    Passive exposure: Past   Smokeless tobacco: Never  Vaping Use   Vaping status: Every Day  Substance Use Topics   Alcohol use: Not Currently    Comment: occasionally   Drug use: Not Currently    Types: Marijuana   Family History  Problem Relation Age of Onset   Thyroid disease Mother    Diabetes Maternal Grandmother    Cancer Maternal Grandmother    Thyroid disease Maternal Grandmother    No Known Allergies   Patient Care Team: Melida Quitter, PA as PCP - General (Family Medicine)   Outpatient Medications Prior to Visit  Medication Sig   dicyclomine (BENTYL) 10 MG capsule Take 10 mg by mouth 4 (four) times  daily -  before meals and at bedtime.   omeprazole (PRILOSEC) 20 MG capsule Take 1 capsule (20 mg total) by mouth daily.   [DISCONTINUED] amphetamine-dextroamphetamine (ADDERALL) 20 MG tablet Take 1 tablet (20 mg total) by mouth 2 (two) times daily.   [DISCONTINUED] levothyroxine (SYNTHROID) 112 MCG tablet Take 1 tablet (112 mcg total) by mouth daily before breakfast.   No facility-administered medications prior to visit.    Review of Systems  Constitutional:  Negative for chills, fever and malaise/fatigue.  HENT:  Negative for congestion and hearing loss.   Eyes:  Negative for blurred vision and double vision.  Respiratory:  Negative for cough and shortness of breath.   Cardiovascular:  Negative for chest pain, palpitations and leg swelling.  Gastrointestinal:  Negative for abdominal pain, constipation, diarrhea and heartburn.  Genitourinary:  Negative for frequency and urgency.  Musculoskeletal:  Negative for myalgias and neck pain.   Neurological:  Negative for headaches.  Endo/Heme/Allergies:  Negative for polydipsia.  Psychiatric/Behavioral:  Negative for depression. The patient is not nervous/anxious and does not have insomnia.       Objective:    BP 123/81 (BP Location: Left Arm, Patient Position: Sitting, Cuff Size: Normal)   Pulse 64   Resp 20   Ht 5\' 10"  (1.778 m)   Wt 170 lb (77.1 kg)   SpO2 98%   BMI 24.39 kg/m    Physical Exam Constitutional:      General: She is not in acute distress.    Appearance: Normal appearance.  HENT:     Head: Normocephalic and atraumatic.     Right Ear: Tympanic membrane, ear canal and external ear normal.     Left Ear: Tympanic membrane, ear canal and external ear normal.     Nose: Nose normal.     Mouth/Throat:     Mouth: Mucous membranes are moist.     Pharynx: No oropharyngeal exudate or posterior oropharyngeal erythema.  Eyes:     Extraocular Movements: Extraocular movements intact.     Conjunctiva/sclera: Conjunctivae normal.     Pupils: Pupils are equal, round, and reactive to light.     Comments: Occasionally wears glasses  Neck:     Thyroid: No thyroid mass, thyromegaly or thyroid tenderness.  Cardiovascular:     Rate and Rhythm: Normal rate and regular rhythm.     Heart sounds: Normal heart sounds. No murmur heard.    No friction rub. No gallop.  Pulmonary:     Effort: Pulmonary effort is normal. No respiratory distress.     Breath sounds: Normal breath sounds. No wheezing, rhonchi or rales.  Abdominal:     General: Abdomen is flat. Bowel sounds are normal. There is no distension.     Palpations: There is no mass.     Tenderness: There is no abdominal tenderness. There is no guarding.  Musculoskeletal:        General: Normal range of motion.     Cervical back: Normal range of motion and neck supple.  Lymphadenopathy:     Cervical: No cervical adenopathy.  Skin:    General: Skin is warm and dry.  Neurological:     Mental Status: She is alert  and oriented to person, place, and time.     Cranial Nerves: No cranial nerve deficit.     Motor: No weakness.     Deep Tendon Reflexes: Reflexes normal.  Psychiatric:        Mood and Affect: Mood normal.  Assessment & Plan:    Routine Health Maintenance and Physical Exam  Immunization History  Administered Date(s) Administered   HPV 9-valent 02/18/2011   Influenza,inj,Quad PF,6+ Mos 03/23/2020, 04/26/2021, 06/19/2022   Tdap 09/28/2013, 09/04/2018    Health Maintenance  Topic Date Due   Hepatitis C Screening  Never done   COVID-19 Vaccine (1 - 2023-24 season) 07/02/2023 (Originally 03/09/2023)   INFLUENZA VACCINE  10/06/2023 (Originally 02/06/2023)   HPV VACCINES (2 - 3-dose series) 06/15/2024 (Originally 03/18/2011)   Cervical Cancer Screening (Pap smear)  01/26/2026   DTaP/Tdap/Td (3 - Td or Tdap) 09/04/2028   HIV Screening  Completed    Reviewed most recent labs including CBC, CMP, lipid panel, A1C, TSH, and vitamin D. All within normal limits. Requesting referral to new OB/GYN to establish care.  Discussed health benefits of physical activity, and encouraged her to engage in regular exercise appropriate for her age and condition.  Hypothyroidism, acquired, autoimmune Assessment & Plan: Followed and managed by endocrinology, most recent TSH 13.050.  Orders: -     Levothyroxine Sodium; Take 1 tablet (112 mcg total) by mouth daily before breakfast.  Dispense: 30 tablet; Refill: 3  Attention deficit disorder (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 Assessment & Plan: Stable, well-controlled.  Continue Adderall 20 mg twice daily.  PMDP reviewed, no aberrancies.  Overdose risk score 280.  Provided refills for the next 3 months.  Orders: -     Amphetamine-Dextroamphetamine; Take 1 tablet (20 mg total) by mouth 2 (two) times daily.  Dispense: 60 tablet; Refill: 0  Screening for cervical cancer -     Ambulatory referral to Obstetrics / Gynecology    Return in about 3 months (around 09/14/2023)  for follow-up for ADHD, in person or video.     Melida Quitter, PA

## 2023-06-16 NOTE — Assessment & Plan Note (Signed)
Followed and managed by endocrinology, most recent TSH 13.050.

## 2023-07-14 ENCOUNTER — Other Ambulatory Visit: Payer: Self-pay

## 2023-07-14 DIAGNOSIS — E039 Hypothyroidism, unspecified: Secondary | ICD-10-CM

## 2023-07-15 ENCOUNTER — Other Ambulatory Visit: Payer: Medicaid Other

## 2023-07-18 ENCOUNTER — Other Ambulatory Visit: Payer: Medicaid Other

## 2023-07-22 ENCOUNTER — Ambulatory Visit: Payer: Medicaid Other | Admitting: "Endocrinology

## 2023-08-09 ENCOUNTER — Other Ambulatory Visit: Payer: Self-pay | Admitting: Family Medicine

## 2023-08-09 DIAGNOSIS — F988 Other specified behavioral and emotional disorders with onset usually occurring in childhood and adolescence: Secondary | ICD-10-CM

## 2023-08-14 ENCOUNTER — Encounter: Payer: Self-pay | Admitting: Family Medicine

## 2023-09-04 ENCOUNTER — Encounter: Payer: Self-pay | Admitting: Family Medicine

## 2023-09-04 ENCOUNTER — Telehealth: Payer: Medicaid Other | Admitting: Family Medicine

## 2023-09-04 VITALS — Wt 170.0 lb

## 2023-09-04 DIAGNOSIS — F988 Other specified behavioral and emotional disorders with onset usually occurring in childhood and adolescence: Secondary | ICD-10-CM | POA: Diagnosis not present

## 2023-09-04 MED ORDER — AMPHETAMINE-DEXTROAMPHETAMINE 20 MG PO TABS: 20.0000 mg | ORAL_TABLET | Freq: Two times a day (BID) | ORAL | 0 refills | Status: DC

## 2023-09-04 NOTE — Assessment & Plan Note (Addendum)
 Stable, well-controlled.  Continue Adderall 20 mg twice daily.  PMDP reviewed, no aberrancies.  Overdose risk score 280.  Last refill sent on 08/11/2023, provided refills for the next 3 months. Continue follow up every three months with in person visits twice annually and video visits twice annually.

## 2023-09-04 NOTE — Progress Notes (Signed)
   Virtual Visit via Video Note  I connected with [name] on 09/04/23 at  8:50 AM EST by a video enabled telemedicine application and verified that I am speaking with the correct person using two identifiers.  Patient Location: Home Provider Location: Office/clinic  I discussed the limitations, risks, security, and privacy concerns of performing an evaluation and management service by video and the availability of in person appointments. I also discussed with the patient that there may be a patient responsible charge related to this service. The patient expressed understanding and agreed to proceed.    Subjective   Patient ID: Vanessa Zavala, female    DOB: 1995-12-28  Age: 28 y.o. MRN: [id number]  Chief Complaint  Patient presents with   Medication Refill    HPI Vanessa Zavala is a 28 y.o. female presenting today for follow up of ADHD. Reports symptoms are well controlled on Adderall 20 mg twice daily. Denies any problems with insomnia, chest pain, palpitations, or SOB.     ROS Negative unless otherwise noted in HPI  Outpatient Medications Prior to Visit  Medication Sig   [START ON 09/10/2023] amphetamine-dextroamphetamine (ADDERALL) 20 MG tablet Take 1 tablet (20 mg total) by mouth 2 (two) times daily.   levothyroxine (SYNTHROID) 112 MCG tablet Take 1 tablet (112 mcg total) by mouth daily before breakfast.   omeprazole (PRILOSEC) 20 MG capsule Take 1 capsule (20 mg total) by mouth daily.   [DISCONTINUED] amphetamine-dextroamphetamine (ADDERALL) 20 MG tablet TAKE 1 TABLET BY MOUTH 2 TIMES DAILY.   [DISCONTINUED] dicyclomine (BENTYL) 10 MG capsule Take 10 mg by mouth 4 (four) times daily -  before meals and at bedtime. (Patient not taking: Reported on 09/04/2023)   No facility-administered medications prior to visit.     Objective:     Physical Exam General: Speaking clearly in complete sentences without any shortness of breath.  Alert and oriented x3.  Normal judgment.  No apparent acute distress.   Assessment & Plan:  Attention deficit disorder (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 Assessment & Plan: Stable, well-controlled.  Continue Adderall 20 mg twice daily.  PMDP reviewed, no aberrancies.  Overdose risk score 280.  Last refill sent on 08/11/2023, provided refills for the next 3 months. Continue follow up every three months with in person visits twice annually and video visits twice annually.  Orders: -     Amphetamine-Dextroamphetamine; Take 1 tablet (20 mg total) by mouth 2 (two) times daily.  Dispense: 60 tablet; Refill: 0 -     Amphetamine-Dextroamphetamine; Take 1 tablet (20 mg total) by mouth 2 (two) times daily.  Dispense: 60 tablet; Refill: 0    Return in about 3 months (around 12/02/2023) for follow-up for ADHD, in person.   I discussed the assessment and treatment plan with the patient. The patient was provided an opportunity to ask questions, and all were answered. The patient agreed with the plan and demonstrated an understanding of the instructions.   The patient was advised to call back or seek an in-person evaluation if the symptoms worsen or if the condition fails to improve as anticipated.  The above assessment and management plan was discussed with the patient. The patient verbalized understanding of and has agreed to the management plan.   Melida Quitter, PA

## 2023-09-05 ENCOUNTER — Other Ambulatory Visit: Payer: Medicaid Other

## 2023-09-08 ENCOUNTER — Ambulatory Visit: Payer: Medicaid Other | Admitting: "Endocrinology

## 2023-09-08 ENCOUNTER — Other Ambulatory Visit: Payer: Self-pay | Admitting: Family Medicine

## 2023-09-08 DIAGNOSIS — F988 Other specified behavioral and emotional disorders with onset usually occurring in childhood and adolescence: Secondary | ICD-10-CM

## 2023-09-08 MED ORDER — AMPHETAMINE-DEXTROAMPHETAMINE 20 MG PO TABS: 20.0000 mg | ORAL_TABLET | Freq: Two times a day (BID) | ORAL | 0 refills | Status: DC

## 2023-09-08 NOTE — Telephone Encounter (Signed)
 Copied from CRM [id number]. Topic: Clinical - Medication Refill >> Sep 08, 2023 11:15 AM Shelah Lewandowsky wrote: Most Recent Primary Care Visit:  Provider: Saralyn Pilar A  Department: [facility]  Visit Type: MYCHART VIDEO VISIT  Date: 09/04/2023  Medication: amphetamine-dextroamphetamine (ADDERALL) 20 MG tablet  Has the patient contacted their pharmacy? No (Agent: If no, request that the patient contact the pharmacy for the refill. If patient does not wish to contact the pharmacy document the reason why and proceed with request.) (Agent: If yes, when and what did the pharmacy advise?)  Is this the correct pharmacy for this prescription? Yes If no, delete pharmacy and type the correct one.  This is the patient's preferred pharmacy:    [facility] - [place], [place] - [address] [place] [address] Phone: [phone] Fax: [phone]   Has the prescription been filled recently? Yes  Is the patient out of the medication? Yes  Has the patient been seen for an appointment in the last year OR does the patient have an upcoming appointment? Yes  Can we respond through MyChart? Yes  Agent: Please be advised that Rx refills may take up to 3 business days. We ask that you follow-up with your pharmacy.

## 2023-09-08 NOTE — Telephone Encounter (Signed)
 Pharmacy change, Adderall prescriptions switched to [facility].  Meds ordered this encounter  Medications   amphetamine-dextroamphetamine (ADDERALL) 20 MG tablet    Sig: Take 1 tablet (20 mg total) by mouth 2 (two) times daily.    Dispense:  60 tablet    Refill:  0   amphetamine-dextroamphetamine (ADDERALL) 20 MG tablet    Sig: Take 1 tablet (20 mg total) by mouth 2 (two) times daily.    Dispense:  60 tablet    Refill:  0    Supervising Provider:   Sandre Kitty [[id number]]   amphetamine-dextroamphetamine (ADDERALL) 20 MG tablet    Sig: Take 1 tablet (20 mg total) by mouth 2 (two) times daily.    Dispense:  60 tablet    Refill:  0    Supervising Provider:   Sandre Kitty [[id number]]

## 2023-09-15 ENCOUNTER — Telehealth: Payer: Medicaid Other | Admitting: Family Medicine

## 2023-10-03 ENCOUNTER — Ambulatory Visit: Payer: Medicaid Other | Admitting: Obstetrics and Gynecology

## 2023-10-03 ENCOUNTER — Other Ambulatory Visit: Payer: Self-pay

## 2023-10-03 ENCOUNTER — Encounter: Payer: Self-pay | Admitting: Obstetrics and Gynecology

## 2023-10-03 VITALS — BP 120/85 | HR 75 | Ht 70.0 in | Wt 176.8 lb

## 2023-10-03 DIAGNOSIS — E063 Autoimmune thyroiditis: Secondary | ICD-10-CM | POA: Diagnosis not present

## 2023-10-03 DIAGNOSIS — E282 Polycystic ovarian syndrome: Secondary | ICD-10-CM

## 2023-10-03 DIAGNOSIS — Z124 Encounter for screening for malignant neoplasm of cervix: Secondary | ICD-10-CM

## 2023-10-03 NOTE — Patient Instructions (Signed)
 Call first day of next cycle to plan for day 21 progesterone if patient wants to know about ovulation

## 2023-10-03 NOTE — Progress Notes (Signed)
    Subjective:    Patient ID: Vanessa Zavala, female    DOB: 1996-01-27, 28 y.o.   MRN: [id number]  HPI 28 yo G1P1 , SVD x 1, seen for discussion of possible PCOS.  Pt was told that her ovaries were "extremely enlarged with cysts."  Cannot find any scans supporting these findings.  Pt does have regular menses usually lasting 4-5 days.  Discussed with patient we can check day 21 progesterone to see if she is in fact ovulating.  Partner may benefit from semen analysis as well.   Review of Systems     Objective:   Physical Exam Vitals:   10/03/23 0945  BP: 120/85  Pulse: 75         Assessment & Plan:   1. 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 PCOS (polycystic ovarian syndrome) Will check pelvic ultrasound to see if ovaries are enlarged and if there is any sonographic evidence of PCOS.  - US PELVIC COMPLETE WITH TRANSVAGINAL; Future  3. Hypothyroidism, acquired, autoimmune   F/u in 5 weeks for gyn follow up  Warden Fillers, MD Faculty Attending, [facility]

## 2023-10-07 ENCOUNTER — Other Ambulatory Visit: Payer: Self-pay | Admitting: Family Medicine

## 2023-10-07 ENCOUNTER — Encounter: Payer: Self-pay | Admitting: Family Medicine

## 2023-10-07 DIAGNOSIS — F988 Other specified behavioral and emotional disorders with onset usually occurring in childhood and adolescence: Secondary | ICD-10-CM

## 2023-10-07 NOTE — Telephone Encounter (Signed)
 I advised pt order was already placed for this prescription which is why her my chart request was denied, but pt knows now her amphetamine-dextroamphetamine (ADDERALL) 20 MG was sent to her pharmacy on today

## 2023-10-08 ENCOUNTER — Ambulatory Visit (HOSPITAL_COMMUNITY)
Admission: RE | Admit: 2023-10-08 | Discharge: 2023-10-08 | Disposition: A | Discharge status: Home / Self Care | Source: Ambulatory Visit | Attending: Obstetrics and Gynecology | Admitting: Obstetrics and Gynecology

## 2023-10-08 DIAGNOSIS — N838 Other noninflammatory disorders of ovary, fallopian tube and broad ligament: Secondary | ICD-10-CM | POA: Diagnosis not present

## 2023-10-08 DIAGNOSIS — E282 Polycystic ovarian syndrome: Secondary | ICD-10-CM | POA: Insufficient documentation

## 2023-10-09 ENCOUNTER — Ambulatory Visit (HOSPITAL_COMMUNITY): Admission: RE | Admit: 2023-10-09 | Source: Ambulatory Visit

## 2023-10-09 ENCOUNTER — Encounter: Payer: Self-pay | Admitting: Obstetrics and Gynecology

## 2023-10-28 DIAGNOSIS — F900 Attention-deficit hyperactivity disorder, predominantly inattentive type: Secondary | ICD-10-CM | POA: Diagnosis not present

## 2023-10-28 DIAGNOSIS — F413 Other mixed anxiety disorders: Secondary | ICD-10-CM | POA: Diagnosis not present

## 2023-10-28 DIAGNOSIS — F4310 Post-traumatic stress disorder, unspecified: Secondary | ICD-10-CM | POA: Diagnosis not present

## 2023-11-10 ENCOUNTER — Ambulatory Visit: Admitting: Medical

## 2023-12-04 ENCOUNTER — Other Ambulatory Visit: Payer: Self-pay

## 2023-12-04 ENCOUNTER — Other Ambulatory Visit: Payer: Self-pay | Admitting: Family Medicine

## 2023-12-04 DIAGNOSIS — F988 Other specified behavioral and emotional disorders with onset usually occurring in childhood and adolescence: Secondary | ICD-10-CM

## 2023-12-04 MED ORDER — AMPHETAMINE-DEXTROAMPHETAMINE 20 MG PO TABS
20.0000 mg | ORAL_TABLET | Freq: Two times a day (BID) | ORAL | 0 refills | Status: DC
Start: 2023-12-04 — End: 2023-12-29

## 2023-12-04 NOTE — Telephone Encounter (Signed)
 Copied from CRM [id number]. Topic: Clinical - Medication Refill >> Dec 04, 2023  1:41 PM Vanessa Zavala S wrote: Medication: amphetamine -dextroamphetamine  (ADDERALL) 20 MG tablet  Has the patient contacted their pharmacy? Yes (Agent: If no, request that the patient contact the pharmacy for the refill. If patient does not wish to contact the pharmacy document the reason why and proceed with request.) (Agent: If yes, when and what did the pharmacy advise?) No refills  This is the patient's preferred pharmacy:  [facility] - [place], [place] - [address] [name] [address] Phone: [phone] Fax: [phone]  Is this the correct pharmacy for this prescription? Yes If no, delete pharmacy and type the correct one.   Has the prescription been filled recently? No  Is the patient out of the medication? Yes  Has the patient been seen for an appointment in the last year OR does the patient have an upcoming appointment? Yes  Can we respond through MyChart? Yes  Agent: Please be advised that Rx refills may take up to 3 business days. We ask that you follow-up with your pharmacy.

## 2023-12-04 NOTE — Telephone Encounter (Signed)
 Medication sent to pharmacy today, confirmed received. Duplicate request.

## 2023-12-26 DIAGNOSIS — R300 Dysuria: Secondary | ICD-10-CM | POA: Diagnosis not present

## 2023-12-26 DIAGNOSIS — R350 Frequency of micturition: Secondary | ICD-10-CM | POA: Diagnosis not present

## 2023-12-26 DIAGNOSIS — R3915 Urgency of urination: Secondary | ICD-10-CM | POA: Diagnosis not present

## 2023-12-29 ENCOUNTER — Ambulatory Visit (INDEPENDENT_AMBULATORY_CARE_PROVIDER_SITE_OTHER)

## 2023-12-29 ENCOUNTER — Ambulatory Visit: Admitting: Family Medicine

## 2023-12-29 ENCOUNTER — Other Ambulatory Visit (HOSPITAL_COMMUNITY)
Admission: RE | Admit: 2023-12-29 | Discharge: 2023-12-29 | Disposition: A | Discharge status: Home / Self Care | Source: Ambulatory Visit

## 2023-12-29 VITALS — BP 113/75 | HR 80 | Ht 70.0 in | Wt 178.0 lb

## 2023-12-29 DIAGNOSIS — N898 Other specified noninflammatory disorders of vagina: Secondary | ICD-10-CM | POA: Diagnosis not present

## 2023-12-29 DIAGNOSIS — F988 Other specified behavioral and emotional disorders with onset usually occurring in childhood and adolescence: Secondary | ICD-10-CM

## 2023-12-29 MED ORDER — LIDOCAINE HCL URETHRAL/MUCOSAL 2 % EX GEL: 1.0000 | CUTANEOUS | 0 refills | Status: AC | PRN

## 2023-12-29 MED ORDER — FLUCONAZOLE 150 MG PO TABS: 150.0000 mg | ORAL_TABLET | Freq: Once | ORAL | 0 refills | Status: AC

## 2023-12-29 MED ORDER — AMPHETAMINE-DEXTROAMPHETAMINE 20 MG PO TABS: 20.0000 mg | ORAL_TABLET | Freq: Two times a day (BID) | ORAL | 0 refills | Status: DC

## 2023-12-29 MED ORDER — AMPHETAMINE-DEXTROAMPHETAMINE 20 MG PO TABS
20.0000 mg | ORAL_TABLET | Freq: Two times a day (BID) | ORAL | 0 refills | Status: DC
Start: 2024-01-02 — End: 2024-03-30

## 2023-12-29 NOTE — Progress Notes (Signed)
 Established Patient Office Visit  Subjective   Patient ID: Vanessa Zavala, female    DOB: 1996/02/15  Age: 28 y.o. MRN: [id number]  Chief Complaint  Patient presents with   Medication Management    HPI  Vanessa Zavala is a 28 y.o. y/o female who presents to the clinic today for follow up on ADHD.   ADHD: Patient currently taking Adderall 20 mg BID for increased focus/attention. Reports she is tolerating this medication well. Denies side effects of dry mouth, palpitations, insomnia, depressed appetite leading to weight loss, etc.   Pt also complaining of a 2 day history of vaginal itching around the clitoral region. Reports that  the itching is worse at night. Denies discharge. Is currently sexually active with one female partner. Of note, patient was diagnosed with a UTI 3 days ago and has been taking antibiotics since then. Patient denies any warts or lesions of the vagina/vulva, just endorses severe itching. Denies abnormal discharge or fevers.    ROS Per HPI.    Objective:     BP 113/75   Pulse 80   Ht 5' 10 (1.778 m)   Wt 178 lb (80.7 kg)   LMP 11/10/2023 (Approximate)   SpO2 99%   BMI 25.54 kg/m    Physical Exam Constitutional:      General: She is not in acute distress.    Appearance: Normal appearance.   Cardiovascular:     Rate and Rhythm: Normal rate and regular rhythm.     Heart sounds: Normal heart sounds. No murmur heard.    No friction rub. No gallop.  Pulmonary:     Effort: Pulmonary effort is normal. No respiratory distress.     Breath sounds: Normal breath sounds.   Musculoskeletal:        General: No swelling.   Skin:    General: Skin is warm and dry.   Neurological:     General: No focal deficit present.     Mental Status: She is alert.   Psychiatric:        Mood and Affect: Mood normal.        Behavior: Behavior normal.        Thought Content: Thought content normal.      No results found for any visits on 12/29/23.    The  ASCVD Risk score (Arnett DK, et al., 2019) failed to calculate for the following reasons:   The 2019 ASCVD risk score is only valid for ages 5 to 45    Assessment & Plan:   Vaginal itching Assessment & Plan: Suspect yeast secondary to recent antibiotic use. 150 mg Diflucan x 1 dose sent to pharmacy. Obtained wet prep self swab and GC/CT today. Will follow up via MyChart with results. If patient still symptomatic with yeast/itching after results are back, consider repeating Diflucan dose x1. Per patient request, have also sent in lidocaine  2% jelly to apply to the vulva at night for itching/pain relief.  Orders: -     Cervicovaginal ancillary only  Attention deficit disorder (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 Assessment & Plan: Stable, well-controlled. Continue Adderall 20 mg BID. PDMP reviewed, no aberrancies. 3 separate 30 day prescriptions sent to her pharmacy which will be available for pick up starting on the 27th of each month. Will follow up in 3 months on ADHD.  Orders: -     Amphetamine -Dextroamphetamine ; Take 1 tablet (20 mg total) by mouth 2 (two) times daily.  Dispense: 60 tablet; Refill: 0 -  Amphetamine -Dextroamphetamine ; Take 1 tablet (20 mg total) by mouth 2 (two) times daily.  Dispense: 60 tablet; Refill: 0 -     Amphetamine -Dextroamphetamine ; Take 1 tablet (20 mg total) by mouth 2 (two) times daily.  Dispense: 60 tablet; Refill: 0  Other orders -     Lidocaine  HCl Urethral/Mucosal; Apply 1 Application topically as needed.  Dispense: 5 mL; Refill: 0 -     Fluconazole; Take 1 tablet (150 mg total) by mouth once for 1 dose.  Dispense: 1 tablet; Refill: 0    No follow-ups on file.    Saddie JULIANNA Sacks, PA-C

## 2023-12-29 NOTE — Assessment & Plan Note (Signed)
 Suspect yeast secondary to recent antibiotic use. 150 mg Diflucan x 1 dose sent to pharmacy. Obtained wet prep self swab and GC/CT today. Will follow up via MyChart with results. If patient still symptomatic with yeast/itching after results are back, consider repeating Diflucan dose x1. Per patient request, have also sent in lidocaine  2% jelly to apply to the vulva at night for itching/pain relief.

## 2023-12-29 NOTE — Patient Instructions (Signed)
 It was nice to see you today!  As we discussed in clinic:  -I have sent in 3 30 day prescriptions of your adderall to [facility]. They will be ready for fill on the 27th of each month! -I have also sent in some topical lidocaine  jelly to use as needed on the vulva for vaginal itching/pain secondary to yeast.  -We are getting a self-swab today that tests for yeast, BV, and trichomonas. We can also do a urine sample to test for gonorrhea/chlamydia. I will follow up with you via MyChart regarding the results and let you know if we need to make changes to medications!   If you have any problems before your next visit feel free to message me via MyChart (minor issues or questions) or call the office, otherwise you may reach out to schedule an office visit.  Thank you! Saddie Sacks, PA-C

## 2023-12-29 NOTE — Assessment & Plan Note (Signed)
 Stable, well-controlled. Continue Adderall 20 mg BID. PDMP reviewed, no aberrancies. 3 separate 30 day prescriptions sent to her pharmacy which will be available for pick up starting on the 27th of each month. Will follow up in 3 months on ADHD.

## 2023-12-31 ENCOUNTER — Ambulatory Visit: Payer: Self-pay

## 2023-12-31 ENCOUNTER — Ambulatory Visit (INDEPENDENT_AMBULATORY_CARE_PROVIDER_SITE_OTHER)

## 2023-12-31 VITALS — BP 109/74 | HR 66 | Ht 70.0 in | Wt 178.1 lb

## 2023-12-31 DIAGNOSIS — N898 Other specified noninflammatory disorders of vagina: Secondary | ICD-10-CM

## 2023-12-31 LAB — CERVICOVAGINAL ANCILLARY ONLY
Bacterial Vaginitis (gardnerella): NEGATIVE
Candida Glabrata: NEGATIVE
Candida Vaginitis: NEGATIVE
Chlamydia: NEGATIVE
Comment: NEGATIVE
Comment: NEGATIVE
Comment: NEGATIVE
Comment: NEGATIVE
Comment: NEGATIVE
Comment: NORMAL
Neisseria Gonorrhea: NEGATIVE
Trichomonas: NEGATIVE

## 2023-12-31 MED ORDER — FLUCONAZOLE 150 MG PO TABS: 150.0000 mg | ORAL_TABLET | Freq: Once | ORAL | 0 refills | Status: AC

## 2023-12-31 MED ORDER — CLOTRIMAZOLE 1 % EX LOTN: 1.0000 | TOPICAL_LOTION | Freq: Two times a day (BID) | CUTANEOUS | 0 refills | Status: DC

## 2023-12-31 NOTE — Telephone Encounter (Signed)
 FYI Only or Action Required?: Action required by provider: request for appointment.  Patient was last seen in primary care on 09/18/2022 by Hanford Powell BRAVO, NP. Called Nurse Triage reporting Vaginal Itching. Symptoms began several days ago. Interventions attempted: Prescription medications: lidocaine  and PO antifungal. Symptoms are: rapidly worsening.  Triage Disposition: See Physician Within 24 Hours  Patient/caregiver understands and will follow disposition?:  Yes will follow disposition  Copied from CRM [id number]. Topic: Clinical - Red Word Triage >> Dec 31, 2023 11:04 AM Emylou G wrote: [facility] that prompted transfer to Nurse Triage: Is extreme pain/itching.. the lab isn't back.. lidocaine  (XYLOCAINE ) 2 % jelly - is not working.. can't even wear pants.. swollen and turning another color Reason for Disposition  MODERATE-SEVERE itching (i.e., interferes with school, work, or sleep)  Answer Assessment - Initial Assessment Questions 1. SYMPTOM: What's the main symptom you're concerned about? (e.g., pain, itching, dryness)    itching 2. LOCATION: Where is the  itching  located? (e.g., inside/outside, left/right)     Outside around clitoris 3. ONSET: When did the  itching  start?     About 5 days 4. PAIN: Is there any pain? If Yes, ask: How bad is it? (Scale: 1-10; mild, moderate, severe)   -  MILD (1-3): Doesn't interfere with normal activities.    -  MODERATE (4-7): Interferes with normal activities (e.g., work or school) or awakens from sleep.     -  SEVERE (8-10): Excruciating pain, unable to do any normal activities.     denies 5. ITCHING: Is there any itching? If Yes, ask: How bad is it? (Scale: 1-10; mild, moderate, severe)     severe 6. CAUSE: What do you think is causing the discharge? Have you had the same problem before? What happened then?     Denies discharge 7. OTHER SYMPTOMS: Do you have any other symptoms? (e.g., fever, itching, vaginal bleeding, pain  with urination, injury to genital area, vaginal foreign body)     Itching and using the PO and topical that she was given 8. PREGNANCY: Is there any chance you are pregnant? When was your last menstrual period?     denies  Protocols used: Vaginal Symptoms-A-AH

## 2023-12-31 NOTE — Assessment & Plan Note (Addendum)
 Not improved with 150 Diflucan dose x 1. Lidocaine  jelly not helping with itching sensation. Advised patient that it can take several days for symptoms to resolve after Difllucan. However, given that patient is currently on dual antibiotic therapy for urine culture positive for staph and e. Coli, will start clotrimazole 1% lotion applied topically BID x 7 days. Also advised patient that it is ok to mix with a 1% hydrocortisone  cream to help with inflammation and itching. I have also refilled the Diflucan x1 to repeat the dose once she is done with antibitoics in 2-3 more days. Vaginal swab from 12/29/23 still not resulted yet. Advised patient that I will follow up with her regarding the results when they arrive, but for now will continue to treat as suspected yeast infection. If vaginal swab comes back negative, suspect vulvar irritant dermatitis secondary to recent antibiotic use.

## 2023-12-31 NOTE — Progress Notes (Signed)
 Established Patient Office Visit  Subjective   Patient ID: Vanessa Zavala, female    DOB: 08-27-1995  Age: 28 y.o. MRN: [id number]  No chief complaint on file.   HPI  Vanessa Zavala is a 28 y.o. y/o female who presents to the clinic today for follow up on vulvar itching/suspected yeast infection.   Patient was seen on 12/29/23 for suspected yeast infection after she developed vaginal and vulvar itching after taking antibiotics for a UTI treated by UC. At that time, patient was prescribed a one time dose of Fluconazole for suspected yeast and was also prescribed topical lidocaine  to help with the itching. She also performed a self swab for BV/Trich/GC/CZ/yeast. She is back in the clinic today because she reports that depsite taking the medication, her symptoms are worsening and not improved. Reports severe itching around her clitoris. Denies any lesions, discharge, or bleeding.  She is sexually active with one female partner.    ROS Per HPI.    Objective:     BP 109/74   Pulse 66   Ht 5' 10 (1.778 m)   Wt 178 lb 1.3 oz (80.8 kg)   LMP 11/10/2023 (Approximate)   SpO2 99%   BMI 25.55 kg/m    Physical Exam Constitutional:      Appearance: Normal appearance.  Genitourinary:    Pubic Area: No rash.      Labia:        Right: No rash, tenderness or lesion.        Left: No rash, tenderness or lesion.      Comments: Labia majora and minora were without lesions or rash. Labia major and clitoral area swollen and slightly inflamed. Areas of excoriation present over labia. No abnormal discharge.  Skin:    General: Skin is warm and dry.   Neurological:     General: No focal deficit present.     Mental Status: She is alert and oriented to person, place, and time.   Psychiatric:        Mood and Affect: Mood normal.        Behavior: Behavior normal.        Thought Content: Thought content normal.      No results found for any visits on 12/31/23.    The ASCVD Risk score  (Arnett DK, et al., 2019) failed to calculate for the following reasons:   The 2019 ASCVD risk score is only valid for ages 4 to 64    Assessment & Plan:   Vaginal itching Assessment & Plan: Not improved with 150 Diflucan dose x 1. Lidocaine  jelly not helping with itching sensation. Advised patient that it can take several days for symptoms to resolve after Difllucan. However, given that patient is currently on dual antibiotic therapy for urine culture positive for staph and e. Coli, will start clotrimazole 1% lotion applied topically BID x 7 days. Also advised patient that it is ok to mix with a 1% hydrocortisone  cream to help with inflammation and itching. I have also refilled the Diflucan x1 to repeat the dose once she is done with antibitoics in 2-3 more days. Vaginal swab from 12/29/23 still not resulted yet. Advised patient that I will follow up with her regarding the results when they arrive, but for now will continue to treat as suspected yeast infection. If vaginal swab comes back negative, suspect vulvar irritant dermatitis secondary to recent antibiotic use.   Other orders -     Fluconazole; Take 1 tablet (  150 mg total) by mouth once for 1 dose.  Dispense: 1 tablet; Refill: 0 -     Clotrimazole; Apply 1 Application topically in the morning and at bedtime for 7 days.  Dispense: 30 mL; Refill: 0    Return if symptoms worsen or fail to improve.    Saddie JULIANNA Sacks, PA-C

## 2023-12-31 NOTE — Patient Instructions (Addendum)
 It was nice to see you today!  As we discussed in clinic:  -I have sent in a topical anti-fungal cream called Clotrimazole to apply to the vulva twice daily for 7 days.  -I have also sent in another refill for a dose of fluconazole. Please wait at least 72 hours from the last dose before taking this next dose.  -It is okay to combine the clotrimazole with a low dose over-the-counter corticosteroid (1% hydrocortisone ) to help with the inflammation and itching.  -As soon as the results are back from your vaginal swab, I will reach out to you via MyChart.   For symptomatic relief: I also recommend trying a [employer]    If you have any problems before your next visit feel free to message me via MyChart (minor issues or questions) or call the office, otherwise you may reach out to schedule an office visit.  Thank you! Saddie Sacks, PA-C

## 2024-01-01 ENCOUNTER — Other Ambulatory Visit: Payer: Self-pay

## 2024-01-01 DIAGNOSIS — Z79899 Other long term (current) drug therapy: Secondary | ICD-10-CM

## 2024-01-01 DIAGNOSIS — L680 Hirsutism: Secondary | ICD-10-CM

## 2024-01-01 MED ORDER — SPIRONOLACTONE 100 MG PO TABS: 100.0000 mg | ORAL_TABLET | Freq: Every day | ORAL | 1 refills | Status: DC

## 2024-01-01 MED ORDER — SPIRONOLACTONE 50 MG PO TABS: 50.0000 mg | ORAL_TABLET | Freq: Every day | ORAL | 0 refills | Status: DC

## 2024-01-29 ENCOUNTER — Other Ambulatory Visit: Payer: Self-pay

## 2024-01-29 DIAGNOSIS — L680 Hirsutism: Secondary | ICD-10-CM

## 2024-01-30 ENCOUNTER — Other Ambulatory Visit: Payer: Self-pay | Admitting: Nurse Practitioner

## 2024-01-30 DIAGNOSIS — E063 Autoimmune thyroiditis: Secondary | ICD-10-CM

## 2024-02-02 ENCOUNTER — Other Ambulatory Visit: Payer: Self-pay

## 2024-02-02 DIAGNOSIS — E063 Autoimmune thyroiditis: Secondary | ICD-10-CM

## 2024-02-03 ENCOUNTER — Other Ambulatory Visit: Payer: Self-pay

## 2024-02-03 DIAGNOSIS — E063 Autoimmune thyroiditis: Secondary | ICD-10-CM

## 2024-02-03 MED ORDER — LEVOTHYROXINE SODIUM 112 MCG PO TABS: 112.0000 ug | ORAL_TABLET | Freq: Every day | ORAL | 3 refills | Status: DC

## 2024-03-03 ENCOUNTER — Telehealth: Payer: Self-pay | Admitting: *Deleted

## 2024-03-03 NOTE — Telephone Encounter (Signed)
 Copied from CRM [id number]. Topic: Clinical - Medication Question >> Mar 03, 2024 12:40 PM Avram MATSU wrote: Reason for CRM: patient stated he medication was denied, I see in the chart this medication is discontinued levothyroxine  (SYNTHROID ) 112 MCG tablet [[id number]]. Patient would like to know why it was denied please advise [phone]

## 2024-03-03 NOTE — Telephone Encounter (Signed)
 This has been taken care of.

## 2024-03-30 ENCOUNTER — Ambulatory Visit (INDEPENDENT_AMBULATORY_CARE_PROVIDER_SITE_OTHER)

## 2024-03-30 VITALS — BP 107/71 | HR 64 | Temp 97.7°F | Ht 70.0 in | Wt 181.1 lb

## 2024-03-30 DIAGNOSIS — F411 Generalized anxiety disorder: Secondary | ICD-10-CM | POA: Insufficient documentation

## 2024-03-30 DIAGNOSIS — E063 Autoimmune thyroiditis: Secondary | ICD-10-CM | POA: Diagnosis not present

## 2024-03-30 DIAGNOSIS — F988 Other specified behavioral and emotional disorders with onset usually occurring in childhood and adolescence: Secondary | ICD-10-CM

## 2024-03-30 DIAGNOSIS — L680 Hirsutism: Secondary | ICD-10-CM | POA: Diagnosis not present

## 2024-03-30 DIAGNOSIS — E039 Hypothyroidism, unspecified: Secondary | ICD-10-CM

## 2024-03-30 DIAGNOSIS — E282 Polycystic ovarian syndrome: Secondary | ICD-10-CM

## 2024-03-30 MED ORDER — AMPHETAMINE-DEXTROAMPHETAMINE 5 MG PO TABS: 5.0000 mg | ORAL_TABLET | Freq: Every day | ORAL | 0 refills | Status: DC

## 2024-03-30 MED ORDER — HYDROXYZINE HCL 50 MG PO TABS: 50.0000 mg | ORAL_TABLET | Freq: Three times a day (TID) | ORAL | 2 refills | Status: DC | PRN

## 2024-03-30 MED ORDER — SPIRONOLACTONE 100 MG PO TABS: 100.0000 mg | ORAL_TABLET | Freq: Every day | ORAL | 1 refills | Status: DC

## 2024-03-30 MED ORDER — AMPHETAMINE-DEXTROAMPHET ER 20 MG PO CP24: 20.0000 mg | ORAL_CAPSULE | Freq: Every day | ORAL | 0 refills | Status: DC

## 2024-03-30 MED ORDER — SERTRALINE HCL 50 MG PO TABS: 50.0000 mg | ORAL_TABLET | Freq: Every day | ORAL | 3 refills | Status: DC

## 2024-03-30 NOTE — Patient Instructions (Signed)
 VISIT SUMMARY: During today's visit, we discussed your increased anxiety symptoms and medication management. We also reviewed your current medications and their side effects, including those for ADHD, thyroid  disorder, and dizziness. A new treatment plan was established to help manage your symptoms more effectively.  YOUR PLAN: -GENERALIZED ANXIETY DISORDER: Generalized anxiety disorder is a condition characterized by chronic anxiety that affects daily life and relationships. We have prescribed sertraline , starting with 25 mg for one week and then increasing to 50 mg. It may take 4-6 weeks to feel the full effect. We also prescribed hydroxyzine  for acute anxiety, to be taken 30 minutes before anxiety-provoking situations. Please monitor for drowsiness when using hydroxyzine .  -ATTENTION-DEFICIT HYPERACTIVITY DISORDER (ADHD): ADHD is a condition that includes symptoms of inattention, hyperactivity, and impulsivity. We have switched your medication to Adderall XR 20 mg for morning use and prescribed Adderall 5 mg immediate release for the afternoon if needed. We will reassess the effectiveness and tolerability of this new regimen in one month.  -HYPOTHYROIDISM: Hypothyroidism is a condition where the thyroid  gland does not produce enough thyroid  hormone. You are currently managing this with levothyroxine  112 mcg daily. We have ordered thyroid  function tests today and plan to do comprehensive blood work before your next physical, including tests for thyroid , kidney, liver, and A1c. We will follow up in three months with a physical examination.  -DIZZINESS: Your dizziness is likely related to the use of spironolactone , which can affect blood pressure. We recommend staying adequately hydrated to support your blood pressure and will monitor your kidney function with the upcoming blood work.  INSTRUCTIONS: Please follow up in one month to reassess the effectiveness and tolerability of your new ADHD medication  regimen. Additionally, schedule a follow-up appointment in three months for a physical examination and to review your comprehensive blood work results, including thyroid  function tests.  If you have any problems before your next visit feel free to message me via MyChart (minor issues or questions) or call the office, otherwise you may reach out to schedule an office visit.  Thank you! Saddie Sacks, PA-C

## 2024-03-30 NOTE — Assessment & Plan Note (Signed)
 Doing well with spironolactone  100 mg daily for Hirsutism. Follows with OB/GYN.

## 2024-03-30 NOTE — Assessment & Plan Note (Signed)
 Chronic anxiety affecting daily life and relationships. No prior anxiety medication, history of sertraline  for depression. - Prescribe sertraline  50 mg, start with 25 mg for one week, then 50 mg every day thereafter. - Discuss sertraline  side effects, including 4-6 weeks for full effect. - Prescribe hydroxyzine  25-50 mg for acute anxiety, take 30 minutes before anxiety-provoking situations. - Advise monitoring for drowsiness with hydroxyzine .

## 2024-03-30 NOTE — Assessment & Plan Note (Signed)
 ADHD managed with Adderall. Interested in extended release for all-day coverage. - Prescribe Adderall XR 20 mg for morning. - Prescribe Adderall 5 mg immediate release for afternoon if needed. - Monitor response and adjust dose as necessary. - Reassess in one month for effectiveness and tolerability. - PDMP reviewed, no aberrancies.

## 2024-03-30 NOTE — Progress Notes (Signed)
 Established Patient Office Visit  Subjective   Patient ID: Vanessa Zavala, female    DOB: 1995/09/22  Age: 28 y.o. MRN: [id number]  Chief Complaint  Patient presents with   Medication Management    HPI  History of Present Illness   Vanessa Zavala is a 28 year old female with anxiety who presents with increased anxiety symptoms and medication management. She is accompanied by her son.  Anxiety symptoms - Long-standing anxiety since teenage years, exacerbated by family dynamics and past trauma - Recent worsening of anxiety, significantly impacting daily life and relationships - Heightened anxiety in social situations, including restaurants and stores, resulting in avoidance of these activities - Anxiety affects ability to perform daily tasks and engage in social activities - Family dynamics, particularly lack of support from her father, contribute to anxiety - No prior use of medication specifically for anxiety  Depressive symptoms - History of depression, previously treated with sertraline   Psychostimulant use and side effects - Currently taking Adderall immediate release, two doses per day - Experiences 'crashes' with current regimen - Interested in switching to extended release formulation or adjusting dosing schedule for more consistent symptom control throughout the day  Benzodiazepine use - Previously treated with clonazepam following the death of her son's father  Thyroid  disorder management - Taking levothyroxine  112 mcg daily for thyroid  management - Consistent with medication adherence - Due for thyroid  function tests  Sleep quality - Sleep is reported as good          ROS Per HPI.    Objective:     BP 107/71   Pulse 64   Temp 97.7 F (36.5 C) (Oral)   Ht 5' 10 (1.778 m)   Wt 181 lb 1.9 oz (82.2 kg)   LMP 03/12/2024   SpO2 100%   BMI 25.99 kg/m    Physical Exam Constitutional:      General: She is not in acute distress.    Appearance:  Normal appearance.  Cardiovascular:     Rate and Rhythm: Normal rate and regular rhythm.     Heart sounds: Normal heart sounds. No murmur heard.    No friction rub. No gallop.  Pulmonary:     Effort: Pulmonary effort is normal. No respiratory distress.     Breath sounds: Normal breath sounds.  Musculoskeletal:        General: No swelling.  Skin:    General: Skin is warm and dry.  Neurological:     General: No focal deficit present.     Mental Status: She is alert.  Psychiatric:        Mood and Affect: Mood normal.        Behavior: Behavior normal.        Thought Content: Thought content normal.     No results found for any visits on 03/30/24.    The ASCVD Risk score (Arnett DK, et al., 2019) failed to calculate for the following reasons:   The 2019 ASCVD risk score is only valid for ages 52 to 20    Assessment & Plan:   Acquired hypothyroidism -     TSH + free T4  Hirsutism -     Spironolactone ; Take 1 tablet (100 mg total) by mouth daily.  Dispense: 90 tablet; Refill: 1  PCOS (polycystic ovarian syndrome) Assessment & Plan: Doing well with spironolactone  100 mg daily for Hirsutism. Follows with OB/GYN.   Orders: -     Comprehensive metabolic panel with GFR  Generalized  anxiety disorder Assessment & Plan: Chronic anxiety affecting daily life and relationships. No prior anxiety medication, history of sertraline  for depression. - Prescribe sertraline  50 mg, start with 25 mg for one week, then 50 mg every day thereafter. - Discuss sertraline  side effects, including 4-6 weeks for full effect. - Prescribe hydroxyzine  25-50 mg for acute anxiety, take 30 minutes before anxiety-provoking situations. - Advise monitoring for drowsiness with hydroxyzine .   Attention deficit disorder (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 Assessment & Plan: ADHD managed with Adderall. Interested in extended release for all-day coverage. - Prescribe Adderall XR 20 mg for morning. - Prescribe Adderall 5 mg  immediate release for afternoon if needed. - Monitor response and adjust dose as necessary. - Reassess in one month for effectiveness and tolerability. - PDMP reviewed, no aberrancies.   Hypothyroidism, acquired, autoimmune Assessment & Plan: Managed with levothyroxine  112 mcg. Regular monitoring due to previous dose adjustments. - Order thyroid  function tests today. - Plan comprehensive blood work before next physical, including thyroid , kidney, liver, and A1c. - Schedule follow-up in three months with physical examination.   Other orders -     Sertraline  HCl; Take 1 tablet (50 mg total) by mouth daily.  Dispense: 30 tablet; Refill: 3 -     Amphetamine -Dextroamphet ER; Take 1 capsule (20 mg total) by mouth daily. We are changing dosages on her Adderall. Ok to do an early pick up for this new dosage change.  Dispense: 30 capsule; Refill: 0 -     Amphetamine -Dextroamphetamine ; Take 1 tablet (5 mg total) by mouth daily.  Dispense: 30 tablet; Refill: 0 -     hydrOXYzine  HCl; Take 1 tablet (50 mg total) by mouth 3 (three) times daily as needed.  Dispense: 30 tablet; Refill: 2    Return in about 3 months (around 06/29/2024) for Physical.    Saddie JULIANNA Sacks, PA-C

## 2024-03-30 NOTE — Assessment & Plan Note (Signed)
 Managed with levothyroxine  112 mcg. Regular monitoring due to previous dose adjustments. - Order thyroid  function tests today. - Plan comprehensive blood work before next physical, including thyroid , kidney, liver, and A1c. - Schedule follow-up in three months with physical examination.

## 2024-03-31 ENCOUNTER — Ambulatory Visit: Payer: Self-pay

## 2024-03-31 LAB — COMPREHENSIVE METABOLIC PANEL WITH GFR
ALT: 15 IU/L (ref 0–32)
AST: 13 IU/L (ref 0–40)
Albumin: 4.5 g/dL (ref 4.0–5.0)
Alkaline Phosphatase: 89 IU/L (ref 41–116)
BUN/Creatinine Ratio: 14 (ref 9–23)
BUN: 13 mg/dL (ref 6–20)
Bilirubin Total: 0.2 mg/dL (ref 0.0–1.2)
CO2: 23 mmol/L (ref 20–29)
Calcium: 9.4 mg/dL (ref 8.7–10.2)
Chloride: 103 mmol/L (ref 96–106)
Creatinine, Ser: 0.94 mg/dL (ref 0.57–1.00)
Globulin, Total: 2.4 g/dL (ref 1.5–4.5)
Glucose: 77 mg/dL (ref 70–99)
Potassium: 4.4 mmol/L (ref 3.5–5.2)
Sodium: 141 mmol/L (ref 134–144)
Total Protein: 6.9 g/dL (ref 6.0–8.5)
eGFR: 85 mL/min/1.73 (ref >59)

## 2024-03-31 LAB — TSH+FREE T4
Free T4: 1.18 ng/dL (ref 0.82–1.77)
TSH: 9.64 u[IU]/mL — ABNORMAL HIGH (ref 0.450–4.500)

## 2024-03-31 MED ORDER — LEVOTHYROXINE SODIUM 125 MCG PO TABS: 125.0000 ug | ORAL_TABLET | Freq: Every day | ORAL | 3 refills | Status: DC

## 2024-03-31 NOTE — Telephone Encounter (Signed)
 Called and LVM/ patient needs to be scheduled for labwork.

## 2024-04-02 MED ORDER — AMPHETAMINE-DEXTROAMPHET ER 30 MG PO CP24: 30.0000 mg | ORAL_CAPSULE | Freq: Every day | ORAL | 0 refills | Status: DC

## 2024-04-02 NOTE — Addendum Note (Signed)
 Addended byBETHA GAYLE NUMBERS on: 04/02/2024 08:10 AM   Modules accepted: Orders

## 2024-04-12 ENCOUNTER — Ambulatory Visit: Payer: Self-pay | Admitting: *Deleted

## 2024-04-12 NOTE — Telephone Encounter (Signed)
 FYI Only or Action Required?: Action required by provider: update on patient condition and change medication back to original dose of adderall.  Patient was last seen in primary care on 03/30/2024 by Gayle Saddie FALCON, PA-C.  Called Nurse Triage reporting Medication Problem.  Symptoms began a week ago.  Interventions attempted: Rest, hydration, or home remedies.  Symptoms are: gradually worsening.  Triage Disposition: Call PCP When Office is Open  Patient/caregiver understands and will follow disposition?: No, wishes to speak with PCP             Copied from CRM #[id number]. Topic: Clinical - Red Word Triage >> Apr 12, 2024  4:19 PM Delon HERO wrote: Red Word that prompted transfer to Nurse Triage: Patient is calling to report that the amphetamine -dextroamphetamine  (ADDERALL XR) 30 MG 24 hr capsule [[id number]] is hurting her stomach, and bowels are runny, Requesting to return adderalll instant release wanting to go back to the 2- 20mg . Please advise Reason for Disposition  [1] Caller has NON-URGENT medicine question about med that PCP prescribed AND [2] triager unable to answer question  Answer Assessment - Initial Assessment Questions Patient requesting call back . Reports this week taking medication adderall XR 30 mg causes stomach to be tore up. Please advise if patient can go back on original dose adderal 20 mg        1. NAME of MEDICINE: What medicine(s) are you calling about?     Adderall XR 30 mg 2. QUESTION: What is your question? (e.g., double dose of medicine, side effect)     Can medication be switched back to original prescription for adderall 20 mg taking 2 times a day  3. PRESCRIBER: Who prescribed the medicine? Reason: if prescribed by specialist, call should be referred to that group.     PCP 4. SYMPTOMS: Do you have any symptoms? If Yes, ask: What symptoms are you having?  How bad are the symptoms (e.g., mild, moderate, severe)     Runny bowels  upset stomach after taking XR medication even with a meal. 5. PREGNANCY:  Is there any chance that you are pregnant? When was your last menstrual period?     na  Protocols used: Medication Question Call-A-AH

## 2024-04-13 ENCOUNTER — Other Ambulatory Visit: Payer: Self-pay

## 2024-04-13 MED ORDER — AMPHETAMINE-DEXTROAMPHETAMINE 20 MG PO TABS: 20.0000 mg | ORAL_TABLET | Freq: Two times a day (BID) | ORAL | 0 refills | Status: DC

## 2024-05-12 ENCOUNTER — Other Ambulatory Visit: Payer: Self-pay

## 2024-05-22 DIAGNOSIS — R300 Dysuria: Secondary | ICD-10-CM | POA: Diagnosis not present

## 2024-05-22 DIAGNOSIS — R319 Hematuria, unspecified: Secondary | ICD-10-CM | POA: Diagnosis not present

## 2024-05-22 DIAGNOSIS — N898 Other specified noninflammatory disorders of vagina: Secondary | ICD-10-CM | POA: Diagnosis not present

## 2024-05-24 ENCOUNTER — Ambulatory Visit (HOSPITAL_BASED_OUTPATIENT_CLINIC_OR_DEPARTMENT_OTHER): Admitting: Radiology

## 2024-05-24 ENCOUNTER — Other Ambulatory Visit (HOSPITAL_BASED_OUTPATIENT_CLINIC_OR_DEPARTMENT_OTHER): Payer: Self-pay

## 2024-05-24 ENCOUNTER — Encounter (HOSPITAL_BASED_OUTPATIENT_CLINIC_OR_DEPARTMENT_OTHER): Payer: Self-pay

## 2024-05-24 ENCOUNTER — Ambulatory Visit (HOSPITAL_BASED_OUTPATIENT_CLINIC_OR_DEPARTMENT_OTHER): Admit: 2024-05-24 | Discharge: 2024-05-24 | Disposition: A | Admitting: Radiology

## 2024-05-24 ENCOUNTER — Ambulatory Visit (HOSPITAL_BASED_OUTPATIENT_CLINIC_OR_DEPARTMENT_OTHER)
Admission: EM | Admit: 2024-05-24 | Discharge: 2024-05-24 | Disposition: A | Discharge status: Home / Self Care | Attending: Family Medicine | Admitting: Family Medicine

## 2024-05-24 DIAGNOSIS — N83202 Unspecified ovarian cyst, left side: Secondary | ICD-10-CM | POA: Diagnosis not present

## 2024-05-24 DIAGNOSIS — M542 Cervicalgia: Secondary | ICD-10-CM | POA: Diagnosis not present

## 2024-05-24 DIAGNOSIS — M25552 Pain in left hip: Secondary | ICD-10-CM | POA: Insufficient documentation

## 2024-05-24 DIAGNOSIS — M79602 Pain in left arm: Secondary | ICD-10-CM | POA: Diagnosis not present

## 2024-05-24 DIAGNOSIS — M79601 Pain in right arm: Secondary | ICD-10-CM | POA: Diagnosis not present

## 2024-05-24 DIAGNOSIS — N390 Urinary tract infection, site not specified: Secondary | ICD-10-CM | POA: Diagnosis not present

## 2024-05-24 DIAGNOSIS — R102 Pelvic and perineal pain unspecified side: Secondary | ICD-10-CM | POA: Diagnosis not present

## 2024-05-24 DIAGNOSIS — R200 Anesthesia of skin: Secondary | ICD-10-CM | POA: Diagnosis not present

## 2024-05-24 DIAGNOSIS — R202 Paresthesia of skin: Secondary | ICD-10-CM | POA: Diagnosis not present

## 2024-05-24 LAB — POCT URINE DIPSTICK
Bilirubin, UA: NEGATIVE
Blood, UA: NEGATIVE
Glucose, UA: NEGATIVE mg/dL
Ketones, POC UA: NEGATIVE mg/dL
Leukocytes, UA: NEGATIVE
Nitrite, UA: POSITIVE — AB
POC PROTEIN,UA: NEGATIVE
Spec Grav, UA: 1.025 (ref 1.010–1.025)
Urobilinogen, UA: 0.2 U/dL
pH, UA: 7 (ref 5.0–8.0)

## 2024-05-24 MED ORDER — TIZANIDINE HCL 4 MG PO TABS
4.0000 mg | ORAL_TABLET | Freq: Three times a day (TID) | ORAL | 0 refills | Status: DC | PRN
  Filled 2024-05-24: qty 30, 10d supply, fill #0

## 2024-05-24 MED ORDER — KETOROLAC TROMETHAMINE 30 MG/ML IJ SOLN
30.0000 mg | Freq: Once | INTRAMUSCULAR | Status: AC
  Administered 2024-05-24: 30 mg via INTRAMUSCULAR
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
## 2024-05-24 NOTE — Discharge Instructions (Addendum)
 Your ultrasound showed a left ovarian cyst. This may be the cause of your pain. Hopefull with time and pain medication it will resolve. We gave you Toradol for pain.  This may have just been aggravated. Appears to be chronic.  Your neck x-ray did not show any concerns.  This may be more muscular related.  The Toradol should help the pain.  I am prescribing a low-dose muscle relaxer for you to take.  You can take extra strength Tylenol  every 8 hours up to 1000 mg. Try heat and ice. When the Toradol wears off you can take 800 mg of ibuprofen  also every 8 hours. See PCP for continued issues.
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
## 2024-05-25 LAB — URINE CULTURE: Culture: NO GROWTH

## 2024-05-26 ENCOUNTER — Ambulatory Visit (HOSPITAL_COMMUNITY): Payer: Self-pay

## 2024-06-08 ENCOUNTER — Other Ambulatory Visit: Payer: Self-pay

## 2024-06-08 ENCOUNTER — Telehealth: Payer: Self-pay

## 2024-06-08 DIAGNOSIS — L680 Hirsutism: Secondary | ICD-10-CM

## 2024-06-08 MED ORDER — AMPHETAMINE-DEXTROAMPHETAMINE 20 MG PO TABS: 20.0000 mg | ORAL_TABLET | Freq: Two times a day (BID) | ORAL | 0 refills | Status: DC

## 2024-06-08 MED ORDER — SPIRONOLACTONE 100 MG PO TABS: 100.0000 mg | ORAL_TABLET | Freq: Every day | ORAL | 1 refills | Status: AC

## 2024-06-08 MED ORDER — SERTRALINE HCL 100 MG PO TABS: 100.0000 mg | ORAL_TABLET | Freq: Every day | ORAL | 3 refills | Status: DC

## 2024-06-08 MED ORDER — SERTRALINE HCL 100 MG PO TABS: 100.0000 mg | ORAL_TABLET | Freq: Every day | ORAL | 3 refills | Status: AC

## 2024-06-08 NOTE — Telephone Encounter (Signed)
 Pt is wanting to increase the Zoloft .  Pt is also needing refills for: amphetamine -dextroamphetamine  (ADDERALL) 20 MG tablet  hydrOXYzine  (ATARAX ) 50 MG tablet  spironolactone  (ALDACTONE ) 100 MG tablet   Pharmacy: [facility]  Pt has an upcoming appt but wanted to pick meds all up at once.

## 2024-06-08 NOTE — Telephone Encounter (Signed)
 Copied from CRM [id number]. Topic: Clinical - Medication Refill >> Jun 08, 2024  2:06 PM Thliyah D wrote: Medication: sertraline  (ZOLOFT ) 50 MG tablet- wants to up the dose doesn't notice anything different  Has the patient contacted their pharmacy? No (Agent: If no, request that the patient contact the pharmacy for the refill. If patient does not wish to contact the pharmacy document the reason why and proceed with request.) (Agent: If yes, when and what did the pharmacy advise?)  This is the patient's preferred pharmacy:  [facility] - [place], [place] - [address] [place] [address] Phone: [phone] Fax: [phone]    Is this the correct pharmacy for this prescription? Yes If no, delete pharmacy and type the correct one.   Has the prescription been filled recently? No  Is the patient out of the medication? Yes  Has the patient been seen for an appointment in the last year OR does the patient have an upcoming appointment? Yes  Can we respond through MyChart? Yes  Agent: Please be advised that Rx refills may take up to 3 business days. We ask that you follow-up with your pharmacy.

## 2024-06-08 NOTE — Telephone Encounter (Signed)
 Rx sent.

## 2024-06-20 ENCOUNTER — Other Ambulatory Visit: Payer: Self-pay

## 2024-06-20 DIAGNOSIS — Z79899 Other long term (current) drug therapy: Secondary | ICD-10-CM

## 2024-06-20 DIAGNOSIS — E039 Hypothyroidism, unspecified: Secondary | ICD-10-CM

## 2024-06-21 ENCOUNTER — Other Ambulatory Visit

## 2024-06-22 DIAGNOSIS — J018 Other acute sinusitis: Secondary | ICD-10-CM | POA: Diagnosis not present

## 2024-06-22 DIAGNOSIS — R0981 Nasal congestion: Secondary | ICD-10-CM | POA: Diagnosis not present

## 2024-06-22 DIAGNOSIS — R051 Acute cough: Secondary | ICD-10-CM | POA: Diagnosis not present

## 2024-06-22 DIAGNOSIS — N3000 Acute cystitis without hematuria: Secondary | ICD-10-CM | POA: Diagnosis not present

## 2024-06-29 ENCOUNTER — Encounter

## 2024-07-05 ENCOUNTER — Ambulatory Visit

## 2024-07-05 VITALS — BP 125/84 | HR 86 | Temp 98.1°F | Ht 70.0 in | Wt 190.1 lb

## 2024-07-05 DIAGNOSIS — L680 Hirsutism: Secondary | ICD-10-CM | POA: Diagnosis not present

## 2024-07-05 DIAGNOSIS — E663 Overweight: Secondary | ICD-10-CM

## 2024-07-05 DIAGNOSIS — Z79899 Other long term (current) drug therapy: Secondary | ICD-10-CM | POA: Diagnosis not present

## 2024-07-05 DIAGNOSIS — Z0000 Encounter for general adult medical examination without abnormal findings: Secondary | ICD-10-CM

## 2024-07-05 DIAGNOSIS — E039 Hypothyroidism, unspecified: Secondary | ICD-10-CM

## 2024-07-05 DIAGNOSIS — E063 Autoimmune thyroiditis: Secondary | ICD-10-CM | POA: Diagnosis not present

## 2024-07-05 DIAGNOSIS — Z23 Encounter for immunization: Secondary | ICD-10-CM | POA: Diagnosis not present

## 2024-07-05 DIAGNOSIS — F411 Generalized anxiety disorder: Secondary | ICD-10-CM | POA: Diagnosis not present

## 2024-07-05 DIAGNOSIS — F988 Other specified behavioral and emotional disorders with onset usually occurring in childhood and adolescence: Secondary | ICD-10-CM | POA: Diagnosis not present

## 2024-07-05 MED ORDER — HYDROXYZINE HCL 50 MG PO TABS: 50.0000 mg | ORAL_TABLET | Freq: Three times a day (TID) | ORAL | 2 refills | Status: AC | PRN

## 2024-07-05 MED ORDER — LEVOTHYROXINE SODIUM 125 MCG PO TABS: 125.0000 ug | ORAL_TABLET | Freq: Every day | ORAL | 3 refills | Status: AC

## 2024-07-05 MED ORDER — PHENTERMINE HCL 37.5 MG PO CAPS: 37.5000 mg | ORAL_CAPSULE | ORAL | 0 refills | Status: DC

## 2024-07-05 NOTE — Assessment & Plan Note (Signed)
 Stable with Zoloft  100 mg and hydroxyzine  as needed.

## 2024-07-05 NOTE — Assessment & Plan Note (Signed)
 Most health maintenance up to date. Pap smear not due until 2027. Declined flu shot. Tetanus up to date. HPV vaccination series discussed and initiated. - Administered second dose of HPV vaccine. - Provided printed lab orders for fasting labs including cholesterol. - Ensured all health maintenance is up to date.

## 2024-07-05 NOTE — Assessment & Plan Note (Signed)
 Weight is elevated today at 190 lbs. Family history noted. Thyroid  dysfunction may contribute. Discussed phentermine and Contrave as options. GLP-1 medications not covered by Medicaid. - Prescribed phentermine 37.5 mg, instructed to split tablet in half for one week, then increase to full tablet if tolerated. Advised that she will need to discontinue Adderall while taking this medication due to both of them being stimulant medications. Patient verbalized understanding. Was ok with this since she has not been taking the Adderall regularly as of recent.  - Ordered thyroid  function tests. - Provided printed lab orders for fasting labs including thyroid  panel and cholesterol.

## 2024-07-05 NOTE — Patient Instructions (Signed)
 VISIT SUMMARY: During your visit, we discussed your weight management concerns, hypothyroidism, hirsutism, ADD, and high-risk HPV infection. We reviewed your current medications and made adjustments where necessary. We also administered the second dose of the HPV vaccine and ensured your general health maintenance is up to date.  YOUR PLAN: OBESITY: Chronic obesity with difficulty losing weight, possibly influenced by thyroid  dysfunction and family history. -Prescribed phentermine 37.5 mg. Split the tablet in half for one week, then increase to a full tablet if tolerated. -Ordered thyroid  function tests. -Provided printed lab orders for fasting labs including thyroid  panel and cholesterol.  HYPOTHYROIDISM: Acquired autoimmune hypothyroidism with potential impact on weight. Previous TSH slightly elevated. -Continue current Synthroid  dose until lab results are reviewed. -Ordered thyroid  function tests.  HIRSUTISM: Improvement in facial hair growth with spironolactone . -Continue spironolactone  100 mg oral daily. -Ordered kidney function tests.  ATTENTION DEFICIT DISORDER (ADD): ADD managed with Adderall. Discussed interaction with phentermine. -Hold Adderall while trialing phentermine.  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 -Administered second dose of HPV vaccine. -Plan for third dose at next appointment.  GENERAL HEALTH MAINTENANCE: Most health maintenance up to date. Pap smear not due until 2027. Declined flu shot. Tetanus up to date. -Administered second dose of HPV vaccine. -Provided printed lab orders for fasting labs including cholesterol. -Ensured all health maintenance is up to date.  If you have any problems before your next visit feel free to message me via MyChart (minor issues or questions) or call the office, otherwise you may reach out to schedule an office visit.  Thank you! Saddie Sacks, PA-C

## 2024-07-05 NOTE — Progress Notes (Signed)
 "  Complete physical exam  Patient: Vanessa Zavala   DOB: 1996/03/11   28 y.o. Female  MRN: [id number]  Subjective:    Chief Complaint  Patient presents with   Annual Exam    History of Present Illness Vanessa Zavala is a 28 year old female with hypothyroidism who presents for a physical exam and discussion of weight loss options.  Weight management concerns - Current weight 190 pounds, previously lost weight from 230 pounds - Concerned about regaining weight and she has noticed her weight continuing to increase over the last few months despite following strict diets  - Has attempted various diets, including ketogenic diet, without success - Difficulty recognizing satiety - Family history of obesity in both parents; father approximately 300 pounds and prescribed phentermine (not taking)  Hypothyroidism - History of hypothyroidism - Currently taking Synthroid  125 mcg  - TSH was slightly elevated in September; thyroid  function to be re-evaluated this week  - Believes hypothyroidism contributes to difficulty losing weight - Facial swelling is the first sign of weight gain, leading to insecurity  Hirsutism 2/2 to PCOS  - Taking spironolactone  for several months - Improvement in facial hair growth since starting spironolactone   Gastrointestinal symptoms/Wellness - No constipation - Regular bowel movements - Overall feeling well and sleeping well  - Eating well balanced       Most recent fall risk assessment:    07/05/2024    1:30 PM  Fall Risk   Falls in the past year? 0  Injury with Fall? 0  Risk for fall due to : No Fall Risks  Follow up Falls evaluation completed     Most recent depression screenings:    07/05/2024    1:30 PM 03/30/2024    2:36 PM  PHQ 2/9 Scores  PHQ - 2 Score 0 0  PHQ- 9 Score 3 0      Data saved with a previous flowsheet row definition    Vision:Within last year and Dental: No current dental problems and Receives regular dental  care    Patient Care Team: Gayle Saddie JULIANNA DEVONNA as PCP - General (Physician Assistant)   Show/hide medication list[1]  ROS  Per HPI      Objective:     BP 125/84   Pulse 86   Temp 98.1 F (36.7 C) (Oral)   Ht 5' 10 (1.778 m)   Wt 190 lb 1.3 oz (86.2 kg)   LMP 06/14/2024   SpO2 100%   BMI 27.27 kg/m    Physical Exam Constitutional:      General: She is not in acute distress.    Appearance: Normal appearance.  Eyes:     Pupils: Pupils are equal, round, and reactive to light.  Cardiovascular:     Rate and Rhythm: Normal rate and regular rhythm.     Heart sounds: Normal heart sounds. No murmur heard.    No friction rub. No gallop.  Pulmonary:     Effort: Pulmonary effort is normal. No respiratory distress.     Breath sounds: Normal breath sounds.  Abdominal:     General: Bowel sounds are normal.     Palpations: Abdomen is soft.  Musculoskeletal:        General: No swelling.     Cervical back: Neck supple.  Lymphadenopathy:     Cervical: No cervical adenopathy.  Skin:    General: Skin is warm and dry.  Neurological:     General: No focal deficit present.  Mental Status: She is alert.  Psychiatric:        Mood and Affect: Mood normal.        Behavior: Behavior normal.        Thought Content: Thought content normal.       No results found for any visits on 07/05/24.     Assessment & Plan:    Routine Health Maintenance and Physical Exam  Health Maintenance  Topic Date Due   Hepatitis C Screening  Never done   HPV Vaccine (3 - 3-dose series) 09/27/2024   COVID-19 Vaccine (1) 07/21/2024*   Flu Shot  10/05/2024*   Pap Smear  01/26/2026   DTaP/Tdap/Td vaccine (8 - Td or Tdap) 09/04/2028   HIV Screening  Completed   Pneumococcal Vaccine  Aged Out   Meningitis B Vaccine  Aged Out  *Topic was postponed. The date shown is not the original due date.    Discussed health benefits of physical activity, and encouraged her to engage in regular  exercise appropriate for her age and condition.  Encounter for vaccination -     HPV 9-valent vaccine,Recombinat  Hypothyroidism, acquired, autoimmune Assessment & Plan: Checking thyroid  panel with labs. If TSH remains elevated, will change dose of levothyroxine . Continue 125 mcg daily for now.   Orders: -     Levothyroxine  Sodium; Take 1 tablet (125 mcg total) by mouth daily.  Dispense: 90 tablet; Refill: 3 -     Thyroid  Panel With TSH; Future  Overweight with body mass index (BMI) 25.0-29.9 Assessment & Plan: Weight is elevated today at 190 lbs. Family history noted. Thyroid  dysfunction may contribute. Discussed phentermine and Contrave as options. GLP-1 medications not covered by Medicaid. - Prescribed phentermine 37.5 mg, instructed to split tablet in half for one week, then increase to full tablet if tolerated. Advised that she will need to discontinue Adderall while taking this medication due to both of them being stimulant medications. Patient verbalized understanding. Was ok with this since she has not been taking the Adderall regularly as of recent.  - Ordered thyroid  function tests. - Provided printed lab orders for fasting labs including thyroid  panel and cholesterol.  Orders: -     Phentermine HCl; Take 1 capsule (37.5 mg total) by mouth every morning.  Dispense: 30 capsule; Refill: 0 -     Thyroid  Panel With TSH; Future -     CBC with Differential/Platelet; Future -     Comprehensive metabolic panel with GFR; Future -     Lipid panel; Future -     Hemoglobin A1c; Future  Generalized anxiety disorder Assessment & Plan: Stable with Zoloft  100 mg and hydroxyzine  as needed.  Orders: -     hydrOXYzine  HCl; Take 1 tablet (50 mg total) by mouth 3 (three) times daily as needed.  Dispense: 30 tablet; Refill: 2  On potassium sparing diuretic therapy -     Comprehensive metabolic panel with GFR -     Comprehensive metabolic panel with GFR  Acquired hypothyroidism -      Thyroid  Panel With TSH  Attention deficit disorder (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 Assessment & Plan: Taking a pause from Adderall to focus on weight loss with phentermine. PDMP reviewed, no aberrancies. Will resume Adderall once course of phentermine in completed/weight loss is achieved.    Hirsutism Assessment & Plan: Improvement in facial hair growth with spironolactone . - Continue spironolactone  100 mg oral daily. - Ordered kidney function tests.   Wellness examination Assessment & Plan: Most health maintenance  up to date. Pap smear not due until 2027. Declined flu shot. Tetanus up to date. HPV vaccination series discussed and initiated. - Administered second dose of HPV vaccine. - Provided printed lab orders for fasting labs including cholesterol. - Ensured all health maintenance is up to date.     Return in about 3 months (around 10/03/2024) for Weight.     Saddie JULIANNA Sacks, PA-C     [1]  Outpatient Medications Prior to Visit  Medication Sig   lidocaine  (XYLOCAINE ) 2 % jelly Apply 1 Application topically as needed.   omeprazole  (PRILOSEC) 20 MG capsule Take 1 capsule (20 mg total) by mouth daily.   sertraline  (ZOLOFT ) 100 MG tablet Take 1 tablet (100 mg total) by mouth daily.   spironolactone  (ALDACTONE ) 100 MG tablet Take 1 tablet (100 mg total) by mouth daily.   VOTRIZA-AL 1 % LOTN APPLY 1 APPLICATION TOPICALLY IN THE MORNING AND AT BEDTIME FOR 7 DAYS.   [DISCONTINUED] amphetamine -dextroamphetamine  (ADDERALL) 20 MG tablet Take 1 tablet (20 mg total) by mouth 2 (two) times daily.   [DISCONTINUED] hydrOXYzine  (ATARAX ) 50 MG tablet Take 1 tablet (50 mg total) by mouth 3 (three) times daily as needed.   [DISCONTINUED] levothyroxine  (SYNTHROID ) 125 MCG tablet Take 1 tablet (125 mcg total) by mouth daily.   [DISCONTINUED] tiZANidine  (ZANAFLEX ) 4 MG tablet Take 1 tablet (4 mg total) by mouth every 8 (eight) hours as needed for muscle spasms. (Patient not taking: Reported on 07/05/2024)   No  facility-administered medications prior to visit.   "

## 2024-07-05 NOTE — Assessment & Plan Note (Signed)
 Taking a pause from Adderall to focus on weight loss with phentermine. PDMP reviewed, no aberrancies. Will resume Adderall once course of phentermine in completed/weight loss is achieved.

## 2024-07-05 NOTE — Assessment & Plan Note (Signed)
 Checking thyroid  panel with labs. If TSH remains elevated, will change dose of levothyroxine . Continue 125 mcg daily for now.

## 2024-07-05 NOTE — Assessment & Plan Note (Signed)
 Improvement in facial hair growth with spironolactone . - Continue spironolactone  100 mg oral daily. - Ordered kidney function tests.

## 2024-07-12 ENCOUNTER — Telehealth: Payer: Self-pay

## 2024-07-12 NOTE — Telephone Encounter (Signed)
 Copied from CRM [id number]. Topic: Clinical - Red Word Triage >> Jul 12, 2024 12:37 PM Berneda FALCON wrote: Red Word that prompted transfer to Nurse Triage: Patient states she has severe stomach pain and burning inside her stomach on and off the past 2 days, on the right side of belly button. Pain woke her up at 1 AM this morning.  She is CURRENTLY at the ER [facility]) for this pain, but does not want to be there due to the people in the ER. Belly is also swollen

## 2024-07-12 NOTE — Telephone Encounter (Signed)
 I can't see the notes from [facility] but I would advise she stay there or go to a different ER to rule out appendicitis or something similar. Please tell her to update us  because we cannot see Vanessa Zavala records in [place]

## 2024-07-13 ENCOUNTER — Telehealth: Payer: Self-pay

## 2024-07-13 DIAGNOSIS — F988 Other specified behavioral and emotional disorders with onset usually occurring in childhood and adolescence: Secondary | ICD-10-CM

## 2024-07-13 MED ORDER — AMPHETAMINE-DEXTROAMPHETAMINE 20 MG PO TABS: 20.0000 mg | ORAL_TABLET | Freq: Two times a day (BID) | ORAL | 0 refills | Status: DC

## 2024-07-13 NOTE — Telephone Encounter (Signed)
 Spoke with pt and she stated that th ER at [facility] said she had an umbilical hernia and they told her to call the surgery place and she said she did and that they told her that she would need a referral from her PCP.  We will request records and then get this sent to the surgery center.  Also pt stated that she had a really bad experience with phentermine  and that she does not want to take that and she would just take here ADHD medication.  I am routing to PCP to request records and to send referral.

## 2024-07-13 NOTE — Progress Notes (Signed)
 Patient sent message stating that she had a reaction to phentermine  causing her to become very angry and express acute depression symptoms. We started this at last OV as patient expressed she wanted something to help her lose weight. We agreed on phentermine  under the condition that she stopped her Adderall due to duplicate therapies and risk of arrhythmia if she stayed on both medications.  She has since discontinued this medication and asking to just go back on her previous medication for ADHD instead.   PDMP reviewed, no aberrancies. Will refill Adderall.   Saddie JULIANNA Sacks, PA-C

## 2024-07-13 NOTE — Telephone Encounter (Signed)
 Copied from CRM #[id number]. Topic: Clinical - Medical Advice >> Jul 13, 2024  9:36 AM Shanda MATSU wrote: Reason for CRM: Patient called in adv that she was having severe depression like symptoms taking the med, phentermine  37.5 MG capsule, so she called the pharmacy where the pharmacist adv her that she should not be taking this med along with med, sertraline  (ZOLOFT ) 100 MG tablet so she stopped taking phentermine  37.5 MG capsule, patient is wanting to know if she can just go back to taking Adderall. Patient also wanted to adv that she went to ER last night where some type of imaging was done, patient is wanting to know if PCP can see the results of the imaging and if so, what recommendations does PCP have in regards to what imaging shows, patient is req a call back.

## 2024-07-14 NOTE — Telephone Encounter (Signed)
 Will place both referrals once I have records from [facility]

## 2024-07-14 NOTE — Telephone Encounter (Addendum)
 Pt calling stating that the [facility] also states she need a referral to GI for colonoscopy due to her having like 3 lesions in the small intestines. They told her that there is a possibility she could have undiagnosed crohns disease.

## 2024-07-15 ENCOUNTER — Other Ambulatory Visit: Payer: Self-pay

## 2024-07-15 ENCOUNTER — Telehealth: Payer: Self-pay | Admitting: *Deleted

## 2024-07-15 DIAGNOSIS — R590 Localized enlarged lymph nodes: Secondary | ICD-10-CM

## 2024-07-15 DIAGNOSIS — K429 Umbilical hernia without obstruction or gangrene: Secondary | ICD-10-CM

## 2024-07-15 NOTE — Telephone Encounter (Signed)
-----   Message from Saddie JULIANNA Sacks sent at 07/15/2024 12:45 PM EST ----- Please let [facility] know I have placed a referral to [facility] in [place] for her umbilical hernia and another referral to GI to address the concern for Crohn's disease at [facility]

## 2024-07-15 NOTE — Telephone Encounter (Signed)
 LVM informing pt of the below message and instructed her to call back if she has any questions.

## 2024-08-11 ENCOUNTER — Other Ambulatory Visit: Payer: Self-pay

## 2024-08-11 DIAGNOSIS — F988 Other specified behavioral and emotional disorders with onset usually occurring in childhood and adolescence: Secondary | ICD-10-CM

## 2024-08-11 NOTE — Telephone Encounter (Unsigned)
 Copied from CRM [id number]. Topic: Clinical - Medication Question >> Aug 11, 2024 11:26 AM Vanessa Zavala wrote: Reason for CRM: Pt calling to check status of medicaiton refill request for medication, amphetamine -dextroamphetamine  (ADDERALL) 20 MG tablet.  Per pt requested med refill via pharmacy today.  Per chart review, medication refill pending. Date/Time  08/11/24 1112 Pend Interface, Surescripts Out  Pt informed of above, verbalized understanding, pt requesting a follow up call when medication refill completed.  Ph. [phone]     Preferred pharmacy: [facility] - [place], [place] - [address] Vanessa Zavala [place] [address] Phone: [phone] Fax: [phone]

## 2024-08-12 ENCOUNTER — Ambulatory Visit: Admitting: Family Medicine

## 2024-08-12 ENCOUNTER — Encounter: Payer: Self-pay | Admitting: Family Medicine

## 2024-08-12 ENCOUNTER — Ambulatory Visit: Payer: Self-pay

## 2024-08-12 VITALS — BP 115/72 | HR 73 | Ht 70.0 in | Wt 201.0 lb

## 2024-08-12 DIAGNOSIS — K429 Umbilical hernia without obstruction or gangrene: Secondary | ICD-10-CM | POA: Insufficient documentation

## 2024-08-12 DIAGNOSIS — R1032 Left lower quadrant pain: Secondary | ICD-10-CM | POA: Insufficient documentation

## 2024-08-12 LAB — POCT URINALYSIS DIP (CLINITEK)
Bilirubin, UA: NEGATIVE
Blood, UA: NEGATIVE
Glucose, UA: NEGATIVE mg/dL
Ketones, POC UA: NEGATIVE mg/dL
Leukocytes, UA: NEGATIVE
Nitrite, UA: NEGATIVE
POC PROTEIN,UA: NEGATIVE
Spec Grav, UA: 1.025
Urobilinogen, UA: 0.2 U/dL
pH, UA: 6

## 2024-08-12 LAB — POCT URINE PREGNANCY: Preg Test, Ur: NEGATIVE

## 2024-08-12 NOTE — Assessment & Plan Note (Signed)
 Umbilical hernia, scheduled for surgical repair Umbilical hernia identified on CT. Surgery scheduled for Monday. Pain not consistent with hernia location. - Proceed with surgical repair if abdominal pain is resolved.

## 2024-08-12 NOTE — Patient Instructions (Signed)
 It was nice to see you today,  We addressed the following topics today: - your urine dipstick did not indicate an infection - your pregnancy test was negative - I am sending in a stat ct scan.  Someone will call about scheduling this.   - go to the ED if you have any fever, vomiting, or if your pain becomes even more severe  Have a great day,  Rolan Slain, MD

## 2024-08-12 NOTE — Telephone Encounter (Signed)
 FYI Only or Action Required?: FYI only for provider: ED advised.  Patient was last seen in primary care on 07/05/2024 by Gayle Saddie FALCON, PA-C.  Called Nurse Triage reporting Abdominal Pain.  Symptoms began several days ago.  Interventions attempted: Rest, hydration, or home remedies.  Symptoms are: gradually worsening.  Triage Disposition: Go to ED Now (Notify PCP)  Patient/caregiver understands and will follow disposition?: Yes  Message from Macy T sent at 08/12/2024 10:48 AM EST  Summary: Abdominal pain   Reason for Triage: Pt reports she is having lower left abdominal stabbing and shooting pain, and pain when using restroom.         Reason for Disposition  [1] SEVERE pain (e.g., excruciating) AND [2] present > 1 hour  Answer Assessment - Initial Assessment Questions 3-4days ago ate Chinese food and within a few hours left lower abdominal cramping and pain. Felt like her cycle was going to start and never did. Radiating to the lower back. Constant pain with worsening during BM or Voiding. Unable to pass gas last night- sharp stabbing pains and developed bloating, looked 9months pregnant.   Macrobid  at home- took to rule out UTI with no relief. Hx of ruptured ovarian cyst that feels different. Has umbillical hernia set to repair on Monday (small with only fatty tissue protruding). In the past possible diverticulitis, remembers seeing it on Discharge paperwork at one point.   Advised ED to evaluate for strangulated hernia, diverticulitis, kidney stone, or ruptured ovarian cyst. Patient will have to bring her son    1. LOCATION: Where does it hurt?      Lower left abdoment 2. RADIATION: Does the pain shoot anywhere else? (e.g., chest, back)     Lower back  3. ONSET: When did the pain begin? (e.g., minutes, hours or days ago)      3-4 days  4. SUDDEN: Gradual or sudden onset?     Sudden  5. PATTERN Does the pain come and go, or is it constant?     Constant but  fluctuates 6. SEVERITY: How bad is the pain?  (e.g., Scale 1-10; mild, moderate, or severe)     10/10 pain  7. RECURRENT SYMPTOM: Have you ever had this type of stomach pain before? If Yes, ask: When was the last time? and What happened that time?      Denies like this-- ruptured ovarian cyst but felt diff 8. CAUSE: What do you think is causing the stomach pain? (e.g., gallstones, recent abdominal surgery)     unsure 9. RELIEVING/AGGRAVATING FACTORS: What makes it better or worse? (e.g., antacids, bending or twisting motion, bowel movement)     Worsened with BM and Voiding, straining  10. OTHER SYMPTOMS: Do you have any other symptoms? (e.g., back pain, diarrhea, fever, urination pain, vomiting)       Bloating, distention,  11. PREGNANCY: Is there any chance you are pregnant? When was your last menstrual period?       UTA  Protocols used: Abdominal Pain - Female-A-AH

## 2024-08-12 NOTE — Assessment & Plan Note (Signed)
 Differential includes nephrolithiasis, diverticulitis, and ovarian cysts. Recent CT showed umbilical hernia and possible diverticulitis. Pain not consistent with hernia location. - Ordered urine test for bacteria and crystals. - Ordered CT scan of the abdomen. - Ordered pregnancy test to rule out ectopic pregnancy. - Consider stent placement or lithotripsy if CT shows kidney stones. - Treat with antibiotics if CT shows kidney infection. - Manage pain if CT shows ovarian cysts.

## 2024-08-12 NOTE — Progress Notes (Unsigned)
 "   Subjective   Patient ID: Vanessa Zavala, female    DOB: 1995/11/28  Age: 29 y.o. MRN: [id number]  Chief Complaint  Patient presents with   Abdominal Pain    Discussed the use of AI scribe software for clinical note transcription with the patient, who gave verbal consent to proceed.  History of Present Illness   Vanessa Zavala is a 29 year old female with PCOS who presents with abdominal pain.  She has had stabbing abdominal pain for four days that started after eating Chinese food. The pain was initially associated with painful urination and now radiates to her back. It worsens with using the bathroom, riding over bumps in the car, and laughing. She notes bloating and difficulty passing gas, and attempts to pass gas increase the stabbing pain.  She denies diarrhea, constipation, or blood in stool or urine and reports regular bowel movements after using an enema to rule out constipation. She feels this pain is different from prior PCOS-related pain.  She recently completed nitrofurantoin  from urgent care for a suspected UTI without culture confirmation. Dysuria resolved after about one and a half days of treatment, and this abdominal pain developed afterward.  She was diagnosed with an umbilical hernia on CT last month after severe abdominal pain and vomiting and is scheduled for surgical repair, but the current pain is not at the hernia site. She reports being told her CT discharge papers mentioned Crohn's disease and diverticulitis, which are new diagnoses for her.  She denies fever but had transient nocturnal chills and body aches that resolved by morning. Her menses are irregular, with the last period at the end of last month, and she has been sexually active recently.          The ASCVD Risk score (Arnett DK, et al., 2019) failed to calculate for the following reasons:   The 2019 ASCVD risk score is only valid for ages 63 to 66   * - Cholesterol units were assumed  Health  Maintenance Due  Topic Date Due   COVID-19 Vaccine (1) Never done   Hepatitis C Screening  Never done   HPV VACCINES (3 - 3-dose series) 09/27/2024      Objective:     BP 115/72   Pulse 73   Ht 5' 10 (1.778 m)   Wt 201 lb (91.2 kg)   LMP 08/04/2024   SpO2 100%   BMI 28.84 kg/m  {Vitals History (Optional):23777}  Physical Exam   Gen: alert, oriented Pulm: no respiratory distress ABDOMEN: Tenderness in the left lower abdomen. Nbs.         Results for orders placed or performed in visit on 08/12/24  POCT URINALYSIS DIP (CLINITEK)  Result Value Ref Range   Color, UA yellow yellow   Clarity, UA clear clear   Glucose, UA negative negative mg/dL   Bilirubin, UA negative negative   Ketones, POC UA negative negative mg/dL   Spec Grav, UA 8.974 8.989 - 1.025   Blood, UA negative negative   pH, UA 6.0 5.0 - 8.0   POC PROTEIN,UA negative negative, trace   Urobilinogen, UA 0.2 0.2 or 1.0 E.U./dL   Nitrite, UA Negative Negative   Leukocytes, UA Negative Negative  POCT urine pregnancy  Result Value Ref Range   Preg Test, Ur Negative Negative        Assessment & Plan:   Left lower quadrant abdominal pain Assessment & Plan: Differential includes nephrolithiasis, diverticulitis, and ovarian cysts. Recent  CT showed umbilical hernia and possible diverticulitis. Pain not consistent with hernia location. - Ordered urine test for bacteria and crystals. - Ordered CT scan of the abdomen. - Ordered pregnancy test to rule out ectopic pregnancy. - Consider stent placement or lithotripsy if CT shows kidney stones. - Treat with antibiotics if CT shows kidney infection. - Manage pain if CT shows ovarian cysts.  Orders: -     POCT URINALYSIS DIP (CLINITEK) -     CT RENAL STONE STUDY; Future -     POCT urine pregnancy -     Urine Microscopic  Umbilical hernia without obstruction and without gangrene Assessment & Plan: Umbilical hernia, scheduled for surgical repair Umbilical  hernia identified on CT. Surgery scheduled for Monday. Pain not consistent with hernia location. - Proceed with surgical repair if abdominal pain is resolved.         Return if symptoms worsen or fail to improve.    Toribio MARLA Slain, MD  "

## 2024-08-13 ENCOUNTER — Telehealth: Payer: Self-pay

## 2024-08-13 LAB — URINALYSIS, MICROSCOPIC ONLY: Casts: NONE SEEN /LPF

## 2024-08-13 NOTE — Telephone Encounter (Signed)
 Spoke with pt this morning and let her know that we received an Approval last night from [facility].  She is scheduled today at 9:30 for the scan

## 2024-08-13 NOTE — Telephone Encounter (Signed)
 Pt was on the doing a virtual therapy session with her son which is why she was unable to answer the phone.   Spoke to the pt and advised providers of the findings and recommendations to go to the ER as soon as possible.  I advised pt to go to [facility] since she didn't want to go to [facility]. Pt said she will have to arrange for her mother to get her son and she will make her way to the hospital today.

## 2024-10-11 ENCOUNTER — Ambulatory Visit
